# Patient Record
Sex: Male | Born: 1985 | Hispanic: Yes | Marital: Married | Smoking: Never smoker
Health system: Southern US, Community
[De-identification: ages and names within clinical notes are randomized; demographics above are authoritative.]

---

## 2021-02-01 ENCOUNTER — Emergency Department
Admission: EM | Admit: 2021-02-01 | Discharge: 2021-02-01 | Disposition: A | Discharge status: Home / Self Care | Payer: Self-pay | Attending: Emergency Medicine | Admitting: Emergency Medicine

## 2021-02-01 ENCOUNTER — Other Ambulatory Visit: Payer: Self-pay

## 2021-02-01 ENCOUNTER — Encounter: Payer: Self-pay | Admitting: Intensive Care

## 2021-02-01 DIAGNOSIS — G6289 Other specified polyneuropathies: Secondary | ICD-10-CM | POA: Insufficient documentation

## 2021-02-01 DIAGNOSIS — F1020 Alcohol dependence, uncomplicated: Secondary | ICD-10-CM | POA: Insufficient documentation

## 2021-02-01 DIAGNOSIS — D696 Thrombocytopenia, unspecified: Secondary | ICD-10-CM | POA: Insufficient documentation

## 2021-02-01 LAB — COMPREHENSIVE METABOLIC PANEL
ALT: 39 U/L (ref 0–44)
AST: 73 U/L — ABNORMAL HIGH (ref 15–41)
Albumin: 3.9 g/dL (ref 3.5–5.0)
Alkaline Phosphatase: 97 U/L (ref 38–126)
Anion gap: 11 (ref 5–15)
BUN: 6 mg/dL (ref 6–20)
CO2: 26 mmol/L (ref 22–32)
Calcium: 9.1 mg/dL (ref 8.9–10.3)
Chloride: 97 mmol/L — ABNORMAL LOW (ref 98–111)
Creatinine, Ser: 0.56 mg/dL — ABNORMAL LOW (ref 0.61–1.24)
GFR, Estimated: >60 mL/min (ref >60)
Glucose, Bld: 98 mg/dL (ref 70–99)
Potassium: 3.5 mmol/L (ref 3.5–5.1)
Sodium: 134 mmol/L — ABNORMAL LOW (ref 135–145)
Total Bilirubin: 3 mg/dL — ABNORMAL HIGH (ref 0.3–1.2)
Total Protein: 8.8 g/dL — ABNORMAL HIGH (ref 6.5–8.1)

## 2021-02-01 LAB — CBC
HCT: 43.4 % (ref 39.0–52.0)
Hemoglobin: 14.4 g/dL (ref 13.0–17.0)
MCH: 30.6 pg (ref 26.0–34.0)
MCHC: 33.2 g/dL (ref 30.0–36.0)
MCV: 92.3 fL (ref 80.0–100.0)
Platelets: 119 10*3/uL — ABNORMAL LOW (ref 150–400)
RBC: 4.7 MIL/uL (ref 4.22–5.81)
RDW: 14.3 % (ref 11.5–15.5)
WBC: 5.5 10*3/uL (ref 4.0–10.5)
nRBC: 0 % (ref 0.0–0.2)

## 2021-02-01 LAB — LIPASE, BLOOD: Lipase: 50 U/L (ref 11–51)

## 2021-02-01 NOTE — ED Provider Notes (Signed)
[facility] EMERGENCY DEPARTMENT Provider Note   CSN: [id number] Arrival date & time: 02/01/21  1410     History Chief Complaint  Patient presents with  . Leg Pain    Adam White is a 35 y.o. male presents to the emerge department for evaluation of numbness and tingling in both hands and feet x2 years.  Patient has not been treated or evaluated for this.  He states that his symptoms are worse after drinking.  He typically will drink 6-12 beers a day over the last 2 years.  He denies any weakness, trauma, edema.  No headaches nausea vomiting.  He denies any current abdominal pain but at times is drinking heavy amounts he will have some abdominal swelling and discomfort.  Patient denies any weakness in the upper or lower extremities.  He is currently not any pain.  HPI     History reviewed. No pertinent past medical history.  There are no problems to display for this patient.   History reviewed. No pertinent surgical history.     History reviewed. No pertinent family history.  Social History   Tobacco Use  . Smoking status: Never Smoker  . Smokeless tobacco: Never Used  Substance Use Topics  . Alcohol use: Yes    Alcohol/week: 12.0 standard drinks    Types: 12 Cans of beer per week  . Drug use: Never    Home Medications Prior to Admission medications   Not on File    Allergies    Patient has no known allergies.  Review of Systems   Review of Systems  Constitutional: Negative for chills and fever.  Respiratory: Negative for shortness of breath.   Cardiovascular: Negative for chest pain.  Gastrointestinal: Negative for nausea and vomiting.  Endocrine: Negative for polydipsia and polyuria.  Musculoskeletal: Negative for arthralgias.  Skin: Negative for color change, pallor, rash and wound.  Neurological: Positive for numbness. Negative for dizziness, weakness and headaches.    Physical Exam Updated Vital Signs BP (!)  139/91 (BP Location: Right Arm)   Pulse 87   Temp 98.4 F (36.9 C) (Oral)   Resp 16   Ht 5\' 6"  (1.676 m)   Wt 98.9 kg   SpO2 97%   BMI 35.19 kg/m   Physical Exam Constitutional:      Appearance: He is well-developed and well-nourished.  HENT:     Head: Normocephalic and atraumatic.  Eyes:     Conjunctiva/sclera: Conjunctivae normal.  Cardiovascular:     Rate and Rhythm: Normal rate.  Pulmonary:     Effort: Pulmonary effort is normal. No respiratory distress.  Abdominal:     Palpations: Abdomen is soft. There is no mass.     Tenderness: There is no abdominal tenderness. There is no guarding or rebound.  Musculoskeletal:        General: Normal range of motion.     Cervical back: Normal range of motion.     Comments: Patient with slight sensation loss in both feet, circumferential, up to the distal third of bilateral tib-fib regions.  There is no swelling edema or calf tenderness.  No warmth or redness.  He is able to ambulate with no antalgic gait and has no weakness on exam with ankle plantarflexion dorsiflexion, knee extension or flexion.  No clonus noted.  Skin:    General: Skin is warm.     Findings: No rash.  Neurological:     Mental Status: He is alert and oriented to person, place,  and time.  Psychiatric:        Mood and Affect: Mood and affect normal.        Behavior: Behavior normal.        Thought Content: Thought content normal.     ED Results / Procedures / Treatments   Labs (all labs ordered are listed, but only abnormal results are displayed) Labs Reviewed  CBC - Abnormal; Notable for the following components:      Result Value   Platelets 119 (*)    All other components within normal limits  COMPREHENSIVE METABOLIC PANEL - Abnormal; Notable for the following components:   Sodium 134 (*)    Chloride 97 (*)    Creatinine, Ser 0.56 (*)    Total Protein 8.8 (*)    AST 73 (*)    Total Bilirubin 3.0 (*)    All other components within normal limits   LIPASE, BLOOD    EKG None  Radiology No results found.  Procedures Procedures   Medications Ordered in ED Medications - No data to display  ED Course  I have reviewed the triage vital signs and the nursing notes.  Pertinent labs & imaging results that were available during my care of the patient were reviewed by me and considered in my medical decision making (see chart for details).    MDM Rules/Calculators/A&P                          35 year old male with history of heavy alcohol consumption.  Patient complains of numbness in his hands and feet consistent with likely peripheral neuropathy.  No weakness.  No neck or back pain.  Symptoms have been chronic.  Patient also complains of occasional abdominal pain.  Not currently having any abdominal pain.  Lab work consistent with heavy alcohol consumption with thrombocytopenia and slightly elevated liver enzymes.  Patient stable.  Encouraged him to establish care with primary care provider and we discussed alcohol cessation.  Patient understands signs symptoms to return to the ER for. Final Clinical Impression(s) / ED Diagnoses Final diagnoses:  Thrombocytopenia (HCC)  Other polyneuropathy  Alcoholism [facility])    Rx / DC Orders ED Discharge Orders    None       Ronnette Juniper 02/01/21 1837    Minna Antis, MD 02/01/21 2235

## 2021-02-01 NOTE — ED Notes (Addendum)
Patient reports pain to bilateral feet and legs x 2 years. Patient reports pain is a throbbing pain, and is constant, but worse with walking. Patient reports hx of heavy alcohol use, and related pancreatitis but reports recent decrease in consumption.

## 2021-02-01 NOTE — ED Triage Notes (Signed)
Patient c/o bilateral leg pain X2 months.

## 2021-02-01 NOTE — Discharge Instructions (Addendum)
Please establish care with a primary care provider.  Please reduce alcohol consumption.  Return to the ER for any worsening symptoms or urgent changes in your health.

## 2021-11-09 ENCOUNTER — Emergency Department: Payer: Medicaid Other

## 2021-11-09 ENCOUNTER — Inpatient Hospital Stay: Payer: Medicaid Other

## 2021-11-09 ENCOUNTER — Other Ambulatory Visit: Payer: Self-pay

## 2021-11-09 ENCOUNTER — Encounter: Payer: Self-pay | Admitting: Radiology

## 2021-11-09 ENCOUNTER — Inpatient Hospital Stay
Admission: EM | Admit: 2021-11-09 | Discharge: 2021-11-30 | DRG: 870 | Disposition: E | Payer: Medicaid Other | Attending: Internal Medicine | Admitting: Internal Medicine

## 2021-11-09 DIAGNOSIS — Z66 Do not resuscitate: Secondary | ICD-10-CM | POA: Diagnosis present

## 2021-11-09 DIAGNOSIS — J13 Pneumonia due to Streptococcus pneumoniae: Secondary | ICD-10-CM | POA: Diagnosis present

## 2021-11-09 DIAGNOSIS — E871 Hypo-osmolality and hyponatremia: Secondary | ICD-10-CM | POA: Diagnosis present

## 2021-11-09 DIAGNOSIS — I81 Portal vein thrombosis: Secondary | ICD-10-CM | POA: Diagnosis present

## 2021-11-09 DIAGNOSIS — Z452 Encounter for adjustment and management of vascular access device: Secondary | ICD-10-CM

## 2021-11-09 DIAGNOSIS — E8729 Other acidosis: Secondary | ICD-10-CM | POA: Diagnosis present

## 2021-11-09 DIAGNOSIS — G928 Other toxic encephalopathy: Secondary | ICD-10-CM | POA: Diagnosis present

## 2021-11-09 DIAGNOSIS — K7040 Alcoholic hepatic failure without coma: Secondary | ICD-10-CM | POA: Diagnosis present

## 2021-11-09 DIAGNOSIS — D696 Thrombocytopenia, unspecified: Secondary | ICD-10-CM | POA: Diagnosis present

## 2021-11-09 DIAGNOSIS — A403 Sepsis due to Streptococcus pneumoniae: Principal | ICD-10-CM | POA: Diagnosis present

## 2021-11-09 DIAGNOSIS — J9602 Acute respiratory failure with hypercapnia: Secondary | ICD-10-CM | POA: Diagnosis present

## 2021-11-09 DIAGNOSIS — F1010 Alcohol abuse, uncomplicated: Secondary | ICD-10-CM | POA: Diagnosis present

## 2021-11-09 DIAGNOSIS — K7031 Alcoholic cirrhosis of liver with ascites: Secondary | ICD-10-CM | POA: Diagnosis present

## 2021-11-09 DIAGNOSIS — Z20822 Contact with and (suspected) exposure to covid-19: Secondary | ICD-10-CM | POA: Diagnosis present

## 2021-11-09 DIAGNOSIS — R000 Tachycardia, unspecified: Secondary | ICD-10-CM

## 2021-11-09 DIAGNOSIS — N179 Acute kidney failure, unspecified: Secondary | ICD-10-CM | POA: Diagnosis not present

## 2021-11-09 DIAGNOSIS — D649 Anemia, unspecified: Secondary | ICD-10-CM | POA: Diagnosis present

## 2021-11-09 DIAGNOSIS — J189 Pneumonia, unspecified organism: Secondary | ICD-10-CM | POA: Diagnosis present

## 2021-11-09 DIAGNOSIS — E876 Hypokalemia: Secondary | ICD-10-CM | POA: Diagnosis present

## 2021-11-09 DIAGNOSIS — E872 Acidosis, unspecified: Secondary | ICD-10-CM | POA: Diagnosis present

## 2021-11-09 DIAGNOSIS — A419 Sepsis, unspecified organism: Secondary | ICD-10-CM

## 2021-11-09 DIAGNOSIS — J9601 Acute respiratory failure with hypoxia: Secondary | ICD-10-CM | POA: Diagnosis present

## 2021-11-09 DIAGNOSIS — K625 Hemorrhage of anus and rectum: Secondary | ICD-10-CM | POA: Diagnosis not present

## 2021-11-09 DIAGNOSIS — E669 Obesity, unspecified: Secondary | ICD-10-CM | POA: Diagnosis present

## 2021-11-09 DIAGNOSIS — R040 Epistaxis: Secondary | ICD-10-CM | POA: Diagnosis not present

## 2021-11-09 DIAGNOSIS — R6521 Severe sepsis with septic shock: Secondary | ICD-10-CM | POA: Diagnosis present

## 2021-11-09 DIAGNOSIS — R7989 Other specified abnormal findings of blood chemistry: Secondary | ICD-10-CM

## 2021-11-09 DIAGNOSIS — Z6838 Body mass index (BMI) 38.0-38.9, adult: Secondary | ICD-10-CM | POA: Diagnosis not present

## 2021-11-09 LAB — PHOSPHORUS: Phosphorus: 1.2 mg/dL — ABNORMAL LOW (ref 2.5–4.6)

## 2021-11-09 LAB — BLOOD GAS, ARTERIAL
Acid-base deficit: 6.1 mmol/L — ABNORMAL HIGH (ref 0.0–2.0)
Acid-base deficit: 8.3 mmol/L — ABNORMAL HIGH (ref 0.0–2.0)
Acid-base deficit: 7.2 mmol/L — ABNORMAL HIGH (ref 0.0–2.0)
Bicarbonate: 22.9 mmol/L (ref 20.0–28.0)
Bicarbonate: 22.8 mmol/L (ref 20.0–28.0)
Bicarbonate: 21.1 mmol/L (ref 20.0–28.0)
FIO2: 1
FIO2: 1
FIO2: 1
MECHVT: 480 mL
MECHVT: 360 mL
MECHVT: 400 mL
Mechanical Rate: 30
O2 Saturation: 88.1 %
O2 Saturation: 95.1 %
O2 Saturation: 97.4 %
PEEP: 12 cmH2O
PEEP: 16 cmH2O
PEEP: 10 cmH2O
Patient temperature: 37
Patient temperature: 37
Patient temperature: 37
RATE: 16 resp/min
RATE: 25 resp/min
RATE: 30 resp/min
pCO2 arterial: 60 mmHg — ABNORMAL HIGH (ref 32.0–48.0)
pCO2 arterial: 75 mmHg (ref 32.0–48.0)
pCO2 arterial: 54 mmHg — ABNORMAL HIGH (ref 32.0–48.0)
pH, Arterial: 7.19 — CL (ref 7.350–7.450)
pH, Arterial: 7.09 — CL (ref 7.350–7.450)
pH, Arterial: 7.2 — ABNORMAL LOW (ref 7.350–7.450)
pO2, Arterial: 68 mmHg — ABNORMAL LOW (ref 83.0–108.0)
pO2, Arterial: 102 mmHg (ref 83.0–108.0)
pO2, Arterial: 115 mmHg — ABNORMAL HIGH (ref 83.0–108.0)

## 2021-11-09 LAB — CBC WITH DIFFERENTIAL/PLATELET
Abs Immature Granulocytes: 0.1 10*3/uL — ABNORMAL HIGH (ref 0.00–0.07)
Basophils Absolute: 0.1 10*3/uL (ref 0.0–0.1)
Basophils Relative: 2 %
Eosinophils Absolute: 0 10*3/uL (ref 0.0–0.5)
Eosinophils Relative: 0 %
HCT: 38.3 % — ABNORMAL LOW (ref 39.0–52.0)
Hemoglobin: 12.9 g/dL — ABNORMAL LOW (ref 13.0–17.0)
Immature Granulocytes: 2 %
Lymphocytes Relative: 14 %
Lymphs Abs: 0.7 10*3/uL (ref 0.7–4.0)
MCH: 28.9 pg (ref 26.0–34.0)
MCHC: 33.7 g/dL (ref 30.0–36.0)
MCV: 85.7 fL (ref 80.0–100.0)
Monocytes Absolute: 0.2 10*3/uL (ref 0.1–1.0)
Monocytes Relative: 4 %
Neutro Abs: 4.1 10*3/uL (ref 1.7–7.7)
Neutrophils Relative %: 78 %
Platelets: 96 10*3/uL — ABNORMAL LOW (ref 150–400)
RBC: 4.47 MIL/uL (ref 4.22–5.81)
RDW: 15.9 % — ABNORMAL HIGH (ref 11.5–15.5)
WBC: 5.2 10*3/uL (ref 4.0–10.5)
nRBC: 0.6 % — ABNORMAL HIGH (ref 0.0–0.2)

## 2021-11-09 LAB — LIPASE, BLOOD: Lipase: 30 U/L (ref 11–51)

## 2021-11-09 LAB — GLUCOSE, CAPILLARY: Glucose-Capillary: 130 mg/dL — ABNORMAL HIGH (ref 70–99)

## 2021-11-09 LAB — URINALYSIS, COMPLETE (UACMP) WITH MICROSCOPIC
Bacteria, UA: NONE SEEN
Bilirubin Urine: NEGATIVE
Glucose, UA: NEGATIVE mg/dL
Ketones, ur: NEGATIVE mg/dL
Leukocytes,Ua: NEGATIVE
Nitrite: NEGATIVE
Protein, ur: 30 mg/dL — AB
Specific Gravity, Urine: 1.034 — ABNORMAL HIGH (ref 1.005–1.030)
Squamous Epithelial / HPF: NONE SEEN (ref 0–5)
WBC, UA: NONE SEEN WBC/hpf (ref 0–5)
pH: 5 (ref 5.0–8.0)

## 2021-11-09 LAB — RESP PANEL BY RT-PCR (FLU A&B, COVID) ARPGX2
Influenza A by PCR: NEGATIVE
Influenza B by PCR: NEGATIVE
SARS Coronavirus 2 by RT PCR: NEGATIVE

## 2021-11-09 LAB — LACTIC ACID, PLASMA
Lactic Acid, Venous: 2.7 mmol/L (ref 0.5–1.9)
Lactic Acid, Venous: 1.7 mmol/L (ref 0.5–1.9)

## 2021-11-09 LAB — SODIUM, URINE, RANDOM: Sodium, Ur: <10 mmol/L

## 2021-11-09 LAB — BASIC METABOLIC PANEL
Anion gap: 8 (ref 5–15)
BUN: 38 mg/dL — ABNORMAL HIGH (ref 6–20)
CO2: 19 mmol/L — ABNORMAL LOW (ref 22–32)
Calcium: 6.8 mg/dL — ABNORMAL LOW (ref 8.9–10.3)
Chloride: 91 mmol/L — ABNORMAL LOW (ref 98–111)
Creatinine, Ser: 0.95 mg/dL (ref 0.61–1.24)
GFR, Estimated: >60 mL/min (ref >60)
Glucose, Bld: 107 mg/dL — ABNORMAL HIGH (ref 70–99)
Potassium: 2.6 mmol/L — CL (ref 3.5–5.1)
Sodium: 118 mmol/L — CL (ref 135–145)

## 2021-11-09 LAB — MRSA NEXT GEN BY PCR, NASAL: MRSA by PCR Next Gen: NOT DETECTED

## 2021-11-09 LAB — TROPONIN I (HIGH SENSITIVITY)
Troponin I (High Sensitivity): 10 ng/L (ref <18)
Troponin I (High Sensitivity): 10 ng/L (ref <18)

## 2021-11-09 LAB — MAGNESIUM: Magnesium: 1.7 mg/dL (ref 1.7–2.4)

## 2021-11-09 LAB — COMPREHENSIVE METABOLIC PANEL
ALT: 25 U/L (ref 0–44)
AST: 58 U/L — ABNORMAL HIGH (ref 15–41)
Albumin: 3 g/dL — ABNORMAL LOW (ref 3.5–5.0)
Alkaline Phosphatase: 22 U/L — ABNORMAL LOW (ref 38–126)
Anion gap: 9 (ref 5–15)
BUN: 41 mg/dL — ABNORMAL HIGH (ref 6–20)
CO2: 21 mmol/L — ABNORMAL LOW (ref 22–32)
Calcium: 7.8 mg/dL — ABNORMAL LOW (ref 8.9–10.3)
Chloride: 87 mmol/L — ABNORMAL LOW (ref 98–111)
Creatinine, Ser: 1.02 mg/dL (ref 0.61–1.24)
GFR, Estimated: >60 mL/min (ref >60)
Glucose, Bld: 144 mg/dL — ABNORMAL HIGH (ref 70–99)
Potassium: 2.8 mmol/L — ABNORMAL LOW (ref 3.5–5.1)
Sodium: 117 mmol/L — CL (ref 135–145)
Total Bilirubin: 3.8 mg/dL — ABNORMAL HIGH (ref 0.3–1.2)
Total Protein: 8.7 g/dL — ABNORMAL HIGH (ref 6.5–8.1)

## 2021-11-09 LAB — PROCALCITONIN: Procalcitonin: 3.83 ng/mL

## 2021-11-09 LAB — PROTIME-INR
INR: 1.4 — ABNORMAL HIGH (ref 0.8–1.2)
Prothrombin Time: 17.3 seconds — ABNORMAL HIGH (ref 11.4–15.2)

## 2021-11-09 LAB — STREP PNEUMONIAE URINARY ANTIGEN: Strep Pneumo Urinary Antigen: POSITIVE — AB

## 2021-11-09 LAB — APTT: aPTT: 39 seconds — ABNORMAL HIGH (ref 24–36)

## 2021-11-09 LAB — OSMOLALITY, URINE: Osmolality, Ur: 454 mOsm/kg (ref 300–900)

## 2021-11-09 LAB — TSH: TSH: 0.573 u[IU]/mL (ref 0.350–4.500)

## 2021-11-09 LAB — SODIUM
Sodium: 121 mmol/L — ABNORMAL LOW (ref 135–145)
Sodium: 123 mmol/L — ABNORMAL LOW (ref 135–145)

## 2021-11-09 MED ORDER — ORAL CARE MOUTH RINSE
15.0000 mL | OROMUCOSAL | Status: DC
  Administered 2021-11-09 – 2021-11-14 (×44): 15 mL via OROMUCOSAL

## 2021-11-09 MED ORDER — SODIUM CHLORIDE 0.9 % IV BOLUS
2000.0000 mL | Freq: Once | INTRAVENOUS | Status: AC
  Administered 2021-11-09: 2000 mL via INTRAVENOUS

## 2021-11-09 MED ORDER — CHLORHEXIDINE GLUCONATE 0.12% ORAL RINSE (MEDLINE KIT)
15.0000 mL | Freq: Two times a day (BID) | OROMUCOSAL | Status: DC
  Administered 2021-11-09 – 2021-11-13 (×9): 15 mL via OROMUCOSAL

## 2021-11-09 MED ORDER — MAGNESIUM SULFATE 2 GM/50ML IV SOLN
2.0000 g | Freq: Once | INTRAVENOUS | Status: AC
  Administered 2021-11-09: 2 g via INTRAVENOUS
  Filled 2021-11-09: qty 50

## 2021-11-09 MED ORDER — FENTANYL BOLUS VIA INFUSION
100.0000 ug | Freq: Once | INTRAVENOUS | Status: AC
  Administered 2021-11-09: 100 ug via INTRAVENOUS
  Filled 2021-11-09: qty 100

## 2021-11-09 MED ORDER — ROCURONIUM BROMIDE 50 MG/5ML IV SOLN
INTRAVENOUS | Status: AC | PRN
  Administered 2021-11-09: 130 mg via INTRAVENOUS

## 2021-11-09 MED ORDER — LORAZEPAM 2 MG/ML IJ SOLN: 1.0000 mg | Freq: Once | INTRAMUSCULAR | Status: AC

## 2021-11-09 MED ORDER — PANTOPRAZOLE SODIUM 40 MG IV SOLR
40.0000 mg | Freq: Every day | INTRAVENOUS | Status: DC
  Administered 2021-11-09 – 2021-11-11 (×3): 40 mg via INTRAVENOUS
  Filled 2021-11-09 (×3): qty 40

## 2021-11-09 MED ORDER — POTASSIUM CHLORIDE 10 MEQ/100ML IV SOLN
10.0000 meq | INTRAVENOUS | Status: AC
  Administered 2021-11-09 (×6): 10 meq via INTRAVENOUS
  Filled 2021-11-09 (×6): qty 100

## 2021-11-09 MED ORDER — LACTATED RINGERS IV SOLN: INTRAVENOUS | Status: DC

## 2021-11-09 MED ORDER — SODIUM CHLORIDE 0.9 % IV SOLN: 500.0000 mg | INTRAVENOUS | Status: DC

## 2021-11-09 MED ORDER — POLYETHYLENE GLYCOL 3350 17 G PO PACK: 17.0000 g | PACK | Freq: Every day | ORAL | Status: DC

## 2021-11-09 MED ORDER — VANCOMYCIN HCL IN DEXTROSE 1-5 GM/200ML-% IV SOLN
1000.0000 mg | Freq: Once | INTRAVENOUS | Status: AC
  Administered 2021-11-09: 1000 mg via INTRAVENOUS

## 2021-11-09 MED ORDER — VANCOMYCIN HCL IN DEXTROSE 1-5 GM/200ML-% IV SOLN
1000.0000 mg | Freq: Two times a day (BID) | INTRAVENOUS | Status: DC
  Administered 2021-11-10: 1000 mg via INTRAVENOUS
  Filled 2021-11-09 (×3): qty 200

## 2021-11-09 MED ORDER — PIPERACILLIN-TAZOBACTAM 3.375 G IVPB
3.3750 g | Freq: Three times a day (TID) | INTRAVENOUS | Status: DC
Start: 2021-11-09 — End: 2021-11-10
  Administered 2021-11-09 – 2021-11-10 (×2): 3.375 g via INTRAVENOUS
  Filled 2021-11-09 (×2): qty 50

## 2021-11-09 MED ORDER — VECURONIUM BROMIDE 10 MG IV SOLR: 10.0000 mg | INTRAVENOUS | Status: DC | PRN

## 2021-11-09 MED ORDER — FENTANYL BOLUS VIA INFUSION
50.0000 ug | INTRAVENOUS | Status: DC | PRN
  Administered 2021-11-09: 50 ug via INTRAVENOUS
  Filled 2021-11-09: qty 100

## 2021-11-09 MED ORDER — PROPOFOL 1000 MG/100ML IV EMUL
5.0000 ug/kg/min | INTRAVENOUS | Status: DC
  Administered 2021-11-09: 50 ug/kg/min via INTRAVENOUS
  Administered 2021-11-09: 70 ug/kg/min via INTRAVENOUS
  Administered 2021-11-09: 5 ug/kg/min via INTRAVENOUS
  Administered 2021-11-10 (×2): 70 ug/kg/min via INTRAVENOUS
  Administered 2021-11-10 (×2): 50 ug/kg/min via INTRAVENOUS
  Administered 2021-11-10 (×2): 70 ug/kg/min via INTRAVENOUS
  Administered 2021-11-10 – 2021-11-11 (×3): 50 ug/kg/min via INTRAVENOUS
  Administered 2021-11-11 (×2): 70 ug/kg/min via INTRAVENOUS
  Administered 2021-11-11: 80 ug/kg/min via INTRAVENOUS
  Administered 2021-11-11: 55 ug/kg/min via INTRAVENOUS
  Administered 2021-11-11: 50 ug/kg/min via INTRAVENOUS
  Administered 2021-11-11: 80 ug/kg/min via INTRAVENOUS
  Administered 2021-11-11: 70 ug/kg/min via INTRAVENOUS
  Administered 2021-11-11 (×2): 50 ug/kg/min via INTRAVENOUS
  Administered 2021-11-12 – 2021-11-13 (×12): 80 ug/kg/min via INTRAVENOUS
  Administered 2021-11-13: 70 ug/kg/min via INTRAVENOUS
  Administered 2021-11-13 (×4): 80 ug/kg/min via INTRAVENOUS
  Filled 2021-11-09 (×38): qty 100

## 2021-11-09 MED ORDER — LORAZEPAM 2 MG/ML IJ SOLN
INTRAMUSCULAR | Status: AC
  Administered 2021-11-09: 1 mg via INTRAVENOUS
  Filled 2021-11-09: qty 1

## 2021-11-09 MED ORDER — POLYETHYLENE GLYCOL 3350 17 G PO PACK
17.0000 g | PACK | Freq: Every day | ORAL | Status: DC
  Administered 2021-11-10 – 2021-11-13 (×4): 17 g
  Filled 2021-11-09 (×4): qty 1

## 2021-11-09 MED ORDER — IOHEXOL 350 MG/ML SOLN
75.0000 mL | Freq: Once | INTRAVENOUS | Status: AC | PRN
  Administered 2021-11-09: 75 mL via INTRAVENOUS

## 2021-11-09 MED ORDER — DOCUSATE SODIUM 100 MG PO CAPS: 100.0000 mg | ORAL_CAPSULE | Freq: Two times a day (BID) | ORAL | Status: DC | PRN

## 2021-11-09 MED ORDER — KETAMINE HCL 50 MG/5ML IJ SOSY
PREFILLED_SYRINGE | INTRAMUSCULAR | Status: AC
  Filled 2021-11-09: qty 5

## 2021-11-09 MED ORDER — SODIUM CHLORIDE 0.9 % IV BOLUS: 2000.0000 mL | Freq: Once | INTRAVENOUS | Status: DC

## 2021-11-09 MED ORDER — FENTANYL CITRATE PF 50 MCG/ML IJ SOSY
50.0000 ug | PREFILLED_SYRINGE | INTRAMUSCULAR | Status: DC | PRN
  Administered 2021-11-09: 100 ug via INTRAVENOUS

## 2021-11-09 MED ORDER — MIDAZOLAM HCL 5 MG/5ML IJ SOLN
INTRAMUSCULAR | Status: AC | PRN
  Administered 2021-11-09 (×2): 2 mg via INTRAVENOUS

## 2021-11-09 MED ORDER — IOHEXOL 300 MG/ML  SOLN: 100.0000 mL | Freq: Once | INTRAMUSCULAR | Status: DC | PRN

## 2021-11-09 MED ORDER — SODIUM CHLORIDE 0.9 % IV SOLN: 2.0000 g | INTRAVENOUS | Status: DC

## 2021-11-09 MED ORDER — FENTANYL 2500MCG IN NS 250ML (10MCG/ML) PREMIX INFUSION
0.0000 ug/h | INTRAVENOUS | Status: DC
  Administered 2021-11-09: 25 ug/h via INTRAVENOUS

## 2021-11-09 MED ORDER — SODIUM CHLORIDE 0.9 % IV SOLN: INTRAVENOUS | Status: DC

## 2021-11-09 MED ORDER — FENTANYL 2500MCG IN NS 250ML (10MCG/ML) PREMIX INFUSION
INTRAVENOUS | Status: AC
  Filled 2021-11-09: qty 250

## 2021-11-09 MED ORDER — SODIUM CHLORIDE 0.9 % IV BOLUS (SEPSIS): 1000.0000 mL | Freq: Once | INTRAVENOUS | Status: DC

## 2021-11-09 MED ORDER — DOCUSATE SODIUM 50 MG/5ML PO LIQD: 100.0000 mg | Freq: Two times a day (BID) | ORAL | Status: DC

## 2021-11-09 MED ORDER — FENTANYL CITRATE PF 50 MCG/ML IJ SOSY
50.0000 ug | PREFILLED_SYRINGE | INTRAMUSCULAR | Status: DC | PRN
  Administered 2021-11-10 – 2021-11-11 (×2): 50 ug via INTRAVENOUS
  Filled 2021-11-09 (×2): qty 1

## 2021-11-09 MED ORDER — ENOXAPARIN SODIUM 60 MG/0.6ML IJ SOSY
0.5000 mg/kg | PREFILLED_SYRINGE | INTRAMUSCULAR | Status: DC
  Filled 2021-11-09: qty 0.5

## 2021-11-09 MED ORDER — FENTANYL CITRATE PF 50 MCG/ML IJ SOSY
PREFILLED_SYRINGE | INTRAMUSCULAR | Status: AC
  Filled 2021-11-09: qty 2

## 2021-11-09 MED ORDER — MIDAZOLAM HCL 2 MG/2ML IJ SOLN
INTRAMUSCULAR | Status: AC
  Filled 2021-11-09: qty 2

## 2021-11-09 MED ORDER — POTASSIUM PHOSPHATES 15 MMOLE/5ML IV SOLN
30.0000 mmol | Freq: Once | INTRAVENOUS | Status: AC
  Administered 2021-11-09: 30 mmol via INTRAVENOUS
  Filled 2021-11-09: qty 10

## 2021-11-09 MED ORDER — NOREPINEPHRINE 4 MG/250ML-% IV SOLN
2.0000 ug/min | INTRAVENOUS | Status: DC
  Filled 2021-11-09: qty 250

## 2021-11-09 MED ORDER — POLYETHYLENE GLYCOL 3350 17 G PO PACK: 17.0000 g | PACK | Freq: Every day | ORAL | Status: DC | PRN

## 2021-11-09 MED ORDER — VANCOMYCIN HCL IN DEXTROSE 1-5 GM/200ML-% IV SOLN
1000.0000 mg | Freq: Once | INTRAVENOUS | Status: AC
  Administered 2021-11-09: 1000 mg via INTRAVENOUS
  Filled 2021-11-09: qty 200

## 2021-11-09 MED ORDER — MORPHINE SULFATE (PF) 2 MG/ML IV SOLN: 2.0000 mg | Freq: Once | INTRAVENOUS | Status: AC

## 2021-11-09 MED ORDER — MIDAZOLAM HCL 2 MG/2ML IJ SOLN
2.0000 mg | INTRAMUSCULAR | Status: DC | PRN
  Administered 2021-11-12: 19:00:00 2 mg via INTRAVENOUS
  Filled 2021-11-09 (×2): qty 2

## 2021-11-09 MED ORDER — CHLORHEXIDINE GLUCONATE CLOTH 2 % EX PADS
6.0000 | MEDICATED_PAD | Freq: Every day | CUTANEOUS | Status: DC
  Administered 2021-11-09 – 2021-11-13 (×4): 6 via TOPICAL

## 2021-11-09 MED ORDER — FENTANYL CITRATE PF 50 MCG/ML IJ SOSY: 50.0000 ug | PREFILLED_SYRINGE | Freq: Once | INTRAMUSCULAR | Status: DC

## 2021-11-09 MED ORDER — FENTANYL 2500MCG IN NS 250ML (10MCG/ML) PREMIX INFUSION
50.0000 ug/h | INTRAVENOUS | Status: DC
Start: 2021-11-09 — End: 2021-11-11
  Administered 2021-11-09 – 2021-11-11 (×4): 200 ug/h via INTRAVENOUS
  Filled 2021-11-09 (×4): qty 250

## 2021-11-09 MED ORDER — SODIUM CHLORIDE 0.9 % IV SOLN
2.0000 g | Freq: Once | INTRAVENOUS | Status: AC
  Administered 2021-11-09: 2 g via INTRAVENOUS
  Filled 2021-11-09: qty 20

## 2021-11-09 MED ORDER — SODIUM CHLORIDE 0.9 % IV SOLN: 250.0000 mL | INTRAVENOUS | Status: DC

## 2021-11-09 MED ORDER — MORPHINE SULFATE (PF) 2 MG/ML IV SOLN
INTRAVENOUS | Status: AC
  Administered 2021-11-09: 2 mg via INTRAVENOUS
  Filled 2021-11-09: qty 1

## 2021-11-09 MED ORDER — NOREPINEPHRINE 4 MG/250ML-% IV SOLN
INTRAVENOUS | Status: AC
  Administered 2021-11-09: 2 ug/min via INTRAVENOUS
  Filled 2021-11-09: qty 250

## 2021-11-09 MED ORDER — ROCURONIUM BROMIDE 10 MG/ML (PF) SYRINGE
PREFILLED_SYRINGE | INTRAVENOUS | Status: AC
  Filled 2021-11-09: qty 10

## 2021-11-09 MED ORDER — MIDAZOLAM HCL 2 MG/2ML IJ SOLN
2.0000 mg | INTRAMUSCULAR | Status: DC | PRN
  Administered 2021-11-11 – 2021-11-12 (×2): 2 mg via INTRAVENOUS
  Filled 2021-11-09: qty 2

## 2021-11-09 MED ORDER — PIPERACILLIN-TAZOBACTAM 3.375 G IVPB 30 MIN: 3.3750 g | Freq: Once | INTRAVENOUS | Status: DC

## 2021-11-09 MED ORDER — ACETAMINOPHEN 500 MG PO TABS
1000.0000 mg | ORAL_TABLET | Freq: Once | ORAL | Status: AC
  Administered 2021-11-09: 1000 mg via ORAL
  Filled 2021-11-09: qty 2

## 2021-11-09 MED ORDER — KETAMINE HCL 10 MG/ML IJ SOLN
INTRAMUSCULAR | Status: AC | PRN
  Administered 2021-11-09 (×5): 10 mg via INTRAVENOUS
  Administered 2021-11-09: 20 mg via INTRAVENOUS
  Administered 2021-11-09 (×2): 10 mg via INTRAVENOUS

## 2021-11-09 MED ORDER — SODIUM CHLORIDE 0.9 % IV SOLN
500.0000 mg | Freq: Once | INTRAVENOUS | Status: AC
  Administered 2021-11-09: 500 mg via INTRAVENOUS
  Filled 2021-11-09: qty 5

## 2021-11-09 MED ORDER — DOCUSATE SODIUM 50 MG/5ML PO LIQD
100.0000 mg | Freq: Two times a day (BID) | ORAL | Status: DC
  Administered 2021-11-10 – 2021-11-13 (×6): 100 mg
  Filled 2021-11-09 (×6): qty 10

## 2021-11-09 MED ORDER — KETAMINE HCL 50 MG/5ML IJ SOSY: 1.0000 mg/kg | PREFILLED_SYRINGE | Freq: Once | INTRAMUSCULAR | Status: DC

## 2021-11-09 NOTE — Progress Notes (Signed)
eLINK following for sepsis protocol. ?

## 2021-11-09 NOTE — Procedures (Signed)
Bronchoscopy Procedure Note  Adam White  [id number]  1986/03/22  Date:11/06/2021  Time:6:50 PM   Provider Performing:Johnanthony Wilden Sharene Butters   Procedure(s):  Flexible bronchoscopy with bronchial alveolar lavage [id number])  Indication(s) Abnormal CT chest  Consent Risks of the procedure as well as the alternatives and risks of each were explained to the patient and/or caregiver.  Consent for the procedure was obtained and is signed in the bedside chart  Anesthesia Propofol   Time Out Verified patient identification, verified procedure, site/side was marked, verified correct patient position, special equipment/implants available, medications/allergies/relevant history reviewed, required imaging and test results available.   Sterile Technique Usual hand hygiene, masks, gowns, and gloves were used   Procedure Description Bronchoscope advanced through endotracheal tube and into airway.  Airways were examined down to subsegmental level with findings noted below.   Following diagnostic evaluation, BAL(s) performed in superior segment of lingula with normal saline and return of 20 cc cloudy, red-tinged fluid  Findings: Airway was grossly bronchitic. No endobronchial obstruction or extrinsic compression noted. No significant mucoid secretions. Plaquelike lesion in the medial aspect of the bronchial wall just distal to the secondary carina on the left; not sampled but could be amenable for brush/forceps biopsy once patient is further stabilized.   Complications/Tolerance None; patient tolerated the procedure well. Chest X-ray is not needed post procedure.   EBL Minimal   Specimen(s) BAL sent for bacterial, fungal, AFB cultures, cytology  Bennie Pierini, MD 11/05/2021 6:55 PM

## 2021-11-09 NOTE — ED Notes (Signed)
MD Malinda in triage room with patient. Patient taken off oxygen via face mask at 6L, and placed on non-rebreather.

## 2021-11-09 NOTE — Consult Note (Signed)
PHARMACY CONSULT NOTE  Pharmacy Consult for Electrolyte Monitoring and Replacement   Recent Labs: Potassium (mmol/L)  Date Value  11/25/21 2.6 (LL)   Calcium (mg/dL)  Date Value  01/75/1025 6.8 (L)   Albumin (g/dL)  Date Value  85/27/7824 3.0 (L)   Sodium (mmol/L)  Date Value  11-25-2021 118 (LL)    Assessment: Patient is a 35 y/o M with history of alcohol use disorder who presented to the ED 12/11 with pleuritic chest pain. CTA interpretation with bilateral pulmonary masses and associated left effusion concerning for malignancy with infectious process or granulomatous disease felt to be less likely. Appears patient was intubated in the ED. Pharmacy consulted to assist with electrolyte monitoring and replacement as indicated.  Goal of Therapy:  Electrolytes within normal limits  Plan:  --Na 118, serum osmolality not available. Suspect hypotonic hypovolemic hyponatremia +/- SIADH (paraneoplastic syndrome). Patient receiving aggressive fluid resuscitation for sepsis --K 2.6, IV Kcl 10 mEq x 6 doses --Check Mg, Phos --Follow-up electrolytes tonight at 2200 and again tomorrow AM  Tressie Ellis 11/25/21 3:29 PM

## 2021-11-09 NOTE — ED Notes (Signed)
Pt taken for CT 

## 2021-11-09 NOTE — ED Triage Notes (Signed)
Patient c/o fever, chest pain, coughing and SOB.

## 2021-11-09 NOTE — Progress Notes (Signed)
CODE SEPSIS - PHARMACY COMMUNICATION  **Broad Spectrum Antibiotics should be administered within 1 hour of Sepsis diagnosis**  Time Code Sepsis Called/Page Received: 1209  Antibiotics Ordered: ceftriaxone/azithromycin/vancomycin  Time of 1st antibiotic administration: 1154   Pricilla Riffle, PharmD, BCPS Clinical Pharmacist 11/07/2021 12:13 PM

## 2021-11-09 NOTE — H&P (Addendum)
NAME:  Adam White, MRN:  [id number], DOB:  [birth year], LOS: 0 ADMISSION DATE:  11/12/2021, CONSULTATION DATE: 11/23/2021 REFERRING MD: Dorothea Glassman, MD, CHIEF COMPLAINT: Shortness of breath  HPI  35 y.o with significant PMH of EtOH abuse and  thrombocytopenia who presented to the ED with chief complaints of progressive shortness of breath.  Patient is currently on high flow with increased work of breathing unable to provide reliable history, history mostly obtained from patient's wife who is currently at the bedside.  Per patient's wife, patient's symptoms started last Thursday with c/o of fevers and chills, chest pain/discomfort, productive cough of brown sputum and poor p.o. intake. Patient developed worsening symptoms today prompting ED visit.  ED Course: On arrival to the ED, he was febrile (!) 102.8 F (39.3 C) with blood pressure 126/76 mm Hg and pulse rate (!) 140 beats/min, respirations of (!) 31 beats per minute with sats of 88 % on NRB.  Patient was noted with increased work of breathing on oxygen via facemask at 6 L, and was placed on nonrebreather.  Patient was subsequently placed on BiPAP for respiratory distress however he was unable to tolerate despite down titrating pressures and IV Ativan.  Patient was placed back on HFNC on 15 L/min / 60% with good tolerance.   Pertinent Labs in Red/Diagnostics Findings: Na+/ K+: 117/2.8 Glucose: 144 BUN/Cr.: 41/1.02 Calcium: 7.8 AST/ALT:58/25   WBC/ TMAX: 5.2/ 102.8 Hgb/Hct: 12.9/38.3 Plts: 96 PCT: 3.83 Lactic acid: 2.7 COVID PCR: Negative   Troponin: 10 BNP: Pending Venous Blood Gas result:  pO2 44.0; pCO2 29; pH 7.48;  HCO3 21.6, %O2 Sat 83.5. EKG read interpreted by me shows sinus tachycardia rate of 141 severe right axis deviation no acute ST-T wave changes are seen.  Given finding as above, code sepsis initiated and patient started on broad-spectrum antibiotics with ceftriaxone, vancomycin and azithromycin. Due  to progressive worsening of hypoxia and need for bronchoscopy patient was intubated in the ED.  PCCM consulted  Past Medical History  EtOH abuse Thrombocytopenia  Significant Hospital Events   12/11: Admitted to ICU with acute hypoxic respiratory failure  Consults:  PCCM  Procedures:  12/11: Intubation  Significant Diagnostic Tests:  12/11: Chest Xray>Rounded density is noted in right lower lobe concerning for mass or nodule. Nodular opacities are seen in the left midlung. Some degree of left pleural effusion is noted as well. CT scan of the chest is recommended for further evaluation. 12/11: CTA chest>Bilateral pulmonary pulmonary masses and confluent nodularity. Dense near complete consolidation of the LEFT lower lobe with associated LEFT effusion. Findings are concerning for pulmonary malignancy. Consider LYMPHOMA. Granulomatous disease or infectious process is much less favored. 12/11: CTA abdomen and pelvis>  Micro Data:  12/11: SARS-CoV-2 PCR> negative 12/11: Influenza PCR> negative 12/11: Blood culture x2> 12/11: Urine Culture> 12/11: MRSA PCR>>  12/11: Strep pneumo urinary antigen> 12/11: Legionella urinary antigen> 12/11: Mycoplasma pneumonia>  Antimicrobials:  Vancomycin 12/11> Azithromycin 12/11> Ceftriaxone 12/11>  OBJECTIVE  Blood pressure (!) 103/48, pulse (!) 127, temperature (!) 102.8 F (39.3 C), temperature source Axillary, resp. rate (!) 41, height 5\' 6"  (1.676 m), weight 98.9 kg, SpO2 94 %.    FiO2 (%):  [60 %] 60 %   Intake/Output Summary (Last 24 hours) at 11/29/2021 1527 Last data filed at 11/03/2021 1349 Gross per 24 hour  Intake 350 ml  Output --  Net 350 ml   Filed Weights   11/29/2021 1107  Weight: 98.9 kg   Physical Examination  GENERAL: 35 year-old critically ill patient lying in the bed intubated and sedated EYES: Pupils equal, round, reactive to light and accommodation. No scleral icterus. Extraocular muscles intact.  HEENT: Head  atraumatic, normocephalic. Oropharynx and nasopharynx clear.  NECK:  Supple, no jugular venous distention. No thyroid enlargement, no tenderness.  LUNGS: Normal breath sounds bilaterally, no wheezing, rales,rhonchi or crepitation. No use of accessory muscles of respiration.  CARDIOVASCULAR: S1, S2 normal. No murmurs, rubs, or gallops.  ABDOMEN: Abdomen distended.  Bowel sounds present. No organomegaly or mass.  EXTREMITIES: No pedal edema, cyanosis, or clubbing.  NEUROLOGIC: Cranial nerves II through XII are intact.  Muscle strength not tested sensation intact. Gait not checked.  PSYCHIATRIC: The patient is intubated and sedated SKIN: No obvious rash, lesion, or ulcer.    Labs/imaging that I havepersonally reviewed  (right click and "Reselect all SmartList Selections" daily)     Labs   CBC: Recent Labs  Lab 2021/11/24 1122  WBC 5.2  NEUTROABS 4.1  HGB 12.9*  HCT 38.3*  MCV 85.7  PLT 96*    Basic Metabolic Panel: Recent Labs  Lab 24-Nov-2021 1122 11-24-21 1340  NA 117* 118*  K 2.8* 2.6*  CL 87* 91*  CO2 21* 19*  GLUCOSE 144* 107*  BUN 41* 38*  CREATININE 1.02 0.95  CALCIUM 7.8* 6.8*   GFR: Estimated Creatinine Clearance: 121.7 mL/min (by C-G formula based on SCr of 0.95 mg/dL). Recent Labs  Lab 11-24-2021 1121 11/24/2021 1122 11-24-2021 1340 2021-11-24 1410  PROCALCITON  --   --  3.83  --   WBC  --  5.2  --   --   LATICACIDVEN 2.7*  --   --  1.7    Liver Function Tests: Recent Labs  Lab November 24, 2021 1122  AST 58*  ALT 25  ALKPHOS 22*  BILITOT 3.8*  PROT 8.7*  ALBUMIN 3.0*   No results for input(s): LIPASE, AMYLASE in the last 168 hours. No results for input(s): AMMONIA in the last 168 hours.  ABG    Component Value Date/Time   HCO3 21.6 2021-11-24 1112   ACIDBASEDEF 0.8 11-24-21 1112   O2SAT 83.5 November 24, 2021 1112     Coagulation Profile: Recent Labs  Lab 2021/11/24 1122  INR 1.4*    Cardiac Enzymes: No results for input(s): CKTOTAL, CKMB, CKMBINDEX,  TROPONINI in the last 168 hours.  HbA1C: No results found for: HGBA1C  CBG: No results for input(s): GLUCAP in the last 168 hours.  Review of Systems:   UNABLE TO OBTAIN DUE PATIENT IN RESPIRATORY DISTRESS  Past Medical History  He,  has no past medical history on file.   Surgical History   No past surgical history on file.   Social History   reports that he has never smoked. He has never used smokeless tobacco. He reports current alcohol use of about 12.0 standard drinks per week. He reports that he does not use drugs.   Family History   His family history is not on file.   Allergies No Known Allergies   Home Medications  Prior to Admission medications   Not on File  Scheduled Meds:  enoxaparin (LOVENOX) injection  0.5 mg/kg Subcutaneous Q24H   fentaNYL       ketamine HCl       ketamine HCl       midazolam       midazolam       pantoprazole (PROTONIX) IV  40 mg Intravenous QHS   rocuronium bromide  rocuronium bromide       Continuous Infusions:  [START ON 11/10/2021] azithromycin     [START ON 11/10/2021] cefTRIAXone (ROCEPHIN)  IV     lactated ringers     piperacillin-tazobactam     potassium chloride     sodium chloride     sodium chloride Stopped (11/01/2021 1553)   vancomycin 1,000 mg (11/13/2021 1456)   PRN Meds:.docusate sodium, iohexol, polyethylene glycol  Active Hospital Problem list     Assessment & Plan:  Acute Hypoxic Respiratory Failure secondary to CAP and possible pleural effusion of unknown etiology CT angio chest also shows bilateral pulmonary masses concerning for possible pulmonary malignancy -continue ventilator support & lung protective strategies -Wean PEEP & FiO2 as tolerated, maintain SpO2 > 90% -Head of bed elevated 30 degrees, VAP protocol in place -Plateau pressures less than 30 cm H20  -Intermittent chest x-ray & ABG PRN -Daily WUA with SBT as tolerated  -Ensure adequate pulmonary hygiene  -Obtain acid-fast eval for TB,  Fungitell, Aspergillus, strep and Legionella antigen -Follow blood cultures, trend lactate/PCT, monitor fever curve -Budesonide nebs BID, bronchodilators PRN -wean sedation/analgesia for RASS goal 0-1 -Plan for bedside bronchoscopy for further eval  Lactic Acidosis improved Hypokalemia -Monitor I&O's / urinary output -Follow BMP -Ensure adequate renal perfusion -Avoid nephrotoxic agents as able -Replace electrolytes as indicated    Hyponatremia  History of EtOH abuse likely due to beer potomania? -Check cortisol, TSH, serum osmolality, sodium osmolality -Start IVFs with NS with goal serum sodium level not > 10 to 12 mEq per L in the first 24 hours and 18 mEq per L in the first 48 hours -Check serial sodium Q4HR -Nephrology consult if appropriate  EtOH Abuse high risk for withdrawal Average Drinks Per Day 6-12 beers/day Last Drink 12/10 No Hx Seizures / DTs  -Check Volatile Screen (EtOH, Osmol Gap), UTox -Check CMP, INR, Daily BMP+Mg -Banana Bag x1 (1L NS or D5W, 100mg  thiamine, 1mg  folate, 1amp/tab MVI, 3g magnesium sulfate) if not tolerating PO -Daily Thiamine, Folate, MVI once tolerating PO -SW consult for cessation resources once last -PT/OT evaluation for mobility  Abdominal Distension # Ascites?  Cirrhosis ? in  a patient with EtOH abuse -Obtain CT abdomen pelvis -Obtain ammonia level and hepatitis panel  Thrombocytopenia Likely in the setting of EtOH abuse -Monitor for signs and symptoms of bleeding  Best practice:  Diet:  NPO Pain/Anxiety/Delirium protocol (if indicated): Yes (RASS goal -1) VAP protocol (if indicated): Yes DVT prophylaxis: Contraindicated GI prophylaxis: PPI Glucose control:  SSI No Central venous access:  N/A Arterial line:  N/A Foley:  Yes, and it is still needed Mobility:  bed rest  PT consulted: N/A Last date of multidisciplinary goals of care discussion [11/12] Code Status:  full code Disposition: ICU   Due to language barrier, an  interpreter was present during the history-taking and subsequent discussion (and for part of the physical exam) with this patient.  = Goals of Care = Code Status Order: FULL  Primary Emergency Contact , Home Phone: [phone] Wishes to pursue full aggressive treatment and intervention options, including CPR and intubation, but goals of care will be addressed on going with family if that should become necessary.  Critical care time: 45 minutes     Burman Blacksmith, DNP, CCRN, FNP-C, AGACNP-BC Acute Care Nurse Practitioner  [facility] Pager: [phone] [facility]  .

## 2021-11-09 NOTE — ED Provider Notes (Addendum)
[facility] Emergency Department Provider Note   ____________________________________________   Event Date/Time   First MD Initiated Contact with Patient 11/06/2021 1115     (approximate)  I have reviewed the triage vital signs and the nursing notes.   HISTORY  Chief Complaint Chest Pain    HPI Adam White is a 35 y.o. male patient with pleuritic chest pain cough productive of brown phlegm and fever.  He is very short of breath.         History reviewed. No pertinent past medical history.  There are no problems to display for this patient.   No past surgical history on file.  Prior to Admission medications   Not on File    Allergies Patient has no known allergies.  No family history on file.  Social History Social History   Tobacco Use   Smoking status: Never   Smokeless tobacco: Never  Substance Use Topics   Alcohol use: Yes    Alcohol/week: 12.0 standard drinks    Types: 12 Cans of beer per week   Drug use: Never    Review of Systems  Constitutional:  fever/chills Eyes: No visual changes. ENT: No sore throat. Cardiovascular: Pleuritic chest pain. Respiratory: shortness of breath. Gastrointestinal: No abdominal pain.  No nausea, no vomiting.  No diarrhea.  No constipation. Genitourinary: Negative for dysuria. Musculoskeletal: Negative for back pain. Skin: Negative for rash. Neurological: Negative for headaches, focal weakness or numbness.  ____________________________________________   PHYSICAL EXAM:  VITAL SIGNS: ED Triage Vitals  Enc Vitals Group     BP 11/20/2021 1103 126/76     Pulse Rate 11/17/2021 1103 (!) 140     Resp 11/22/2021 1103 (!) 31     Temp 11/11/2021 1103 (!) 102.8 F (39.3 C)     Temp Source 11/28/2021 1103 Axillary     SpO2 11/27/2021 1103 (!) 88 %     Weight 11/12/2021 1107 218 lb 0.6 oz (98.9 kg)     Height 10/30/2021 1107 5\' 6"  (1.676 m)     Head Circumference --      Peak Flow --       Pain Score 11/02/2021 1106 10     Pain Loc --      Pain Edu? --      Excl. in [place]? --     Constitutional: Alert and oriented.  Ill-appearing and in respiratory distress Eyes: Conjunctivae are normal.  Head: Atraumatic. Nose: No congestion/rhinnorhea. Mouth/Throat: Mucous membranes are moist. Neck: No stridor.  Cardiovascular: Normal rate, regular rhythm. Grossly normal heart sounds.  Good peripheral circulation. Respiratory: Increased respiratory effort.  retractions. Lungs difficult to hear but appear to be crackles in the right lower lung base Gastrointestinal: Soft and nontender. No distention. No abdominal bruits. No CVA tenderness. Musculoskeletal: No lower extremity tenderness nor edema.  Neurologic:  Normal speech and language. No gross focal neurologic deficits are appreciated.  Skin:  Skin is warm, dry and intact. No rash noted.   ____________________________________________   LABS (all labs ordered are listed, but only abnormal results are displayed)  Labs Reviewed  PROTIME-INR - Abnormal; Notable for the following components:      Result Value   Prothrombin Time 17.3 (*)    INR 1.4 (*)    All other components within normal limits  LACTIC ACID, PLASMA - Abnormal; Notable for the following components:   Lactic Acid, Venous 2.7 (*)    All other components within normal limits  CBC WITH DIFFERENTIAL/PLATELET -  Abnormal; Notable for the following components:   Hemoglobin 12.9 (*)    HCT 38.3 (*)    RDW 15.9 (*)    Platelets 96 (*)    nRBC 0.6 (*)    Abs Immature Granulocytes 0.10 (*)    All other components within normal limits  COMPREHENSIVE METABOLIC PANEL - Abnormal; Notable for the following components:   Sodium 117 (*)    Potassium 2.8 (*)    Chloride 87 (*)    CO2 21 (*)    Glucose, Bld 144 (*)    BUN 41 (*)    Calcium 7.8 (*)    Total Protein 8.7 (*)    Albumin 3.0 (*)    AST 58 (*)    Alkaline Phosphatase 22 (*)    Total Bilirubin 3.8 (*)    All other  components within normal limits  BLOOD GAS, VENOUS - Abnormal; Notable for the following components:   pH, Ven 7.48 (*)    pCO2, Ven 29 (*)    All other components within normal limits  APTT - Abnormal; Notable for the following components:   aPTT 39 (*)    All other components within normal limits  RESP PANEL BY RT-PCR (FLU A&B, COVID) ARPGX2  CULTURE, BLOOD (ROUTINE X 2)  CULTURE, BLOOD (ROUTINE X 2)  URINE CULTURE  LACTIC ACID, PLASMA  PROCALCITONIN  URINALYSIS, COMPLETE (UACMP) WITH MICROSCOPIC  BASIC METABOLIC PANEL  TROPONIN I (HIGH SENSITIVITY)  TROPONIN I (HIGH SENSITIVITY)   ____________________________________________  EKG  EKG read interpreted by me shows sinus tachycardia rate of 141 severe right axis deviation no acute ST-T wave changes are seen ____________________________________________  RADIOLOGY Gertha Calkin, personally viewed and evaluated these images (plain radiographs) as part of my medical decision making, as well as reviewing the written report by the radiologist.  ED MD interpretation: X-ray reviewed by me appears to show bilateral infiltrate.  We will get the flu test back soon as possible.  Almost looks like there is some nodules in the left side of the lung possibly staph we will add vancomycin.  Official radiology report(s): DG Chest Portable 1 View  Result Date: 11/26/2021 CLINICAL DATA:  Shortness of breath. EXAM: PORTABLE CHEST 1 VIEW COMPARISON:  None. FINDINGS: The heart size and mediastinal contours are within normal limits. Rounded density is seen in right lower lobe concerning for mass or nodule. Nodular opacities are also seen in the left perihilar region. Left pleural effusion may be present as well. The visualized skeletal structures are unremarkable. IMPRESSION: Rounded density is noted in right lower lobe concerning for mass or nodule. Nodular opacities are seen in the left midlung. Some degree of left pleural effusion is noted as  well. CT scan of the chest is recommended for further evaluation. Electronically Signed   By: Marijo Conception M.D.   On: 11/25/2021 11:48    ____________________________________________   PROCEDURES  Procedure(s) performed (including Critical Care): Critical care time 1.5 hours.  This includes seeing the patient in triage and speaking with him examining him there in the presence of his wife and the translator and the nurse.  Then we moved him my self helping to room 6 where I examined him again with his wife and new nurses and the translator.  I have also reviewed his past history and his lab work and x-rays and other studies.  I also spent a good deal of time titrating his oxygen with the help of the respiratory therapist.  We found the patient could not tolerate BiPAP even with some sedation and had to go with high flow heated nasal cannula.  speaking with the hospitalist also took time.  Hospitalist has asked me to paged the ICU doctor as patient is still very tachycardic.  CT was read by radiology as being suspicious for malignancy most likely lymphoma.  Procalcitonin is still pending at this time. Please note: Preparation for and performance of an aftercare for intubation added another 20 minutes to the above critical care time this is especially since we push to ketamine slowly.  Ketamine was pushed slowly to avoid interrupting respiratory drive. Procedures   ____________________________________________   INITIAL IMPRESSION / ASSESSMENT AND PLAN / ED COURSE  PT PTT were ordered because apparently patient was thought to be taking Coumadin but when I asked and myself that he said now and is not taking any other blood thinners are not sure what the correct answer is for this. Patient's wife reports he is not drinking as much beer as he had been.  Patient in fact reports he is not taking any medicine and his old records bear this out.  He was unable to tolerate the BiPAP so we have transferred  him to high flow O2.  100% nonrebreather was only keeping him at 90 to 91%.   ----------------------------------------- 12:41 PM on 12/03/21 ----------------------------------------- Radiology wants a CT to evaluate numerous densities in the patient's chest.  I do need to add that the right upper lobe density was part of his BiPAP filter.  The left and right lower lobe densities however are intrapulmonary. ----------------------------------------- 1:11 PM on 12/03/21 ----------------------------------------- Patient went for CT.  Patient has too much respiratory artifact to get a good clear CT reading however there does not appear to be any abscess at least and no obvious tumor to me will await the radiologist reading of course but I did in the meantime I will get him in the hospital.  We will repeat his BMP shortly to see if we have made any progress with his sodium and potassium etc.   ----------------------------------------- 1:31 PM on 2021-12-03 ----------------------------------------- Spoken to the hospitalist and we are going to draw the second BNP in a minute or 2. ----------------------------------------- 2:04 PM on 03-Dec-2021 ----------------------------------------- Hospitalist wants me to call ICU is patient still very tachycardic.  I have asked for them to be paged.  CT has been read as possible lymphoma.  Patient still tachycardic tachypneic but not as much of either.  Heart rate is down to 129 now.  Still has 102.8 fever. ----------------------------------------- 2:25 PM on 12/03/21 -----------------------------------------  Discussed with ICU doctor.  They are going to come and see him.  They asked for more antibiotics suggest Zosyn.  I have done this.  Second set of lites is pending. ____________________________________________   FINAL CLINICAL IMPRESSION(S) / ED DIAGNOSES  Final diagnoses:  Hyponatremia  Hypokalemia  Tachycardia  Sepsis with acute organ  dysfunction and septic shock, due to unspecified organism, unspecified type (HCC)  Community acquired pneumonia, unspecified laterality  Elevated lactic acid level     ED Discharge Orders     None        Note:  This document was prepared using Dragon voice recognition software and may include unintentional dictation errors.    Arnaldo Natal, MD 2021/12/03 1311    Arnaldo Natal, MD 12-03-21 1316    Arnaldo Natal, MD 03-Dec-2021 1426 At request of ICU doctor patient was intubated.  Consent  was obtained  using interpreter.  Patient was sedated with 2 of Versed and then given 50 of ketamine 10 mg at a time although the last 20 were given at once.  Patient then was given another 2 of Versed and was given more ketamine.  He became very sleepy and we gave him the rocuronium.  Patient was attempted to intubate with #8 ET tube and glide scope.  ET tube was hung up in vallecula had to pull it out and bagged him this was done twice and he was intubated successfully patient doing well ET tube visualized going through the cords bilateral breath sounds and good color change I then placed a NG tube after measuring it.  NG tube placement was checked by appearing gurgles in the stomach.  Chest x-ray showed end of ET tube about 1 and half centimeter above carina 25 to the lips and showed the NG tube going below the diaphragm into the stomach although the end of the NG tube was not visualized.  Patient is stable sats are at about 90% blood pressure is 0000000 systolic and heart rate is 130-140.   Nena Polio, MD 11/20/2021 [phone]

## 2021-11-09 NOTE — Plan of Care (Signed)
MICU Attestation  Patient seen and examined and relevant ancillary tests reviewed.  I agree with the assessment and plan of care as outlined by Rufina Falco, NP. This patient was not seen as a shared visit. The following reflects my independent critical care time for 11/28/2021.  Mr. Adam White is a 35 y/o [place] American male presenting with fever, productive cough, pleurisy and hypoxia. He did poorly on BiPAP and was transitioned to [facility] with continued increased work of breathing. He was started on empiric antibiotics and sepsis protocol was initiated. CT chest showed bilateral nodular opacities with a masslike opacity in the right lower lobe along with complete atelectasis of the left lower lobe. The decision was made to intubate the patient for increased work of breathing and for bronchoscopy. Labs notable for hyponatremia, hypokalemia, thrombocytopenia and elevated Tbili.  Blood pressure (!) 159/90, pulse (!) 139, temperature 99.3 F (37.4 C), temperature source Axillary, resp. rate 17, height _0  (1.676 m), weight 98.9 kg, SpO2 93 %.   Intake/Output Summary (Last 24 hours) at 11/08/2021 1646 Last data filed at 11/13/2021 1349 Gross per 24 hour  Intake 350 ml  Output --  Net 350 ml    Vent Mode: AC FiO2 (%):  [60 %-100 %] 100 % Set Rate:  [18 bmp-22 bmp] 22 bmp Vt Set:  [480 mL] 480 mL PEEP:  [12 cmH20] 12 cmH20  On exam he is on HHFNC with increased work of breathing. Diffuse crackles are present bilaterally. He is tachycardic. Abdomen is distended. Mild jaundice.  Impression/Plan: Acute hypoxic respiratory failure Abnormal CT chest with concern for atypical infection versus malignancy Hyponatremia Hypokalemia Elevated LFTs Lactic acidosis  - Maintain on mechanical ventilation per ARDSNet protocol - Target 6 cc/kg IBW; pH>7.20 - RASS goal -4 for now given significant hypoxia - Will perform bedside bronchoscopy for BAL and to assess for obstruction - Send BAL for  cell count/diff, bacterial, fungal, AFB cultures and cytology - Send urine histo, blasto, serum Fungitell and Aspergillus - Continue broad spectrum antibiotics; f/u culture data - Continuous normal saline; sodium checks q4 - Goal Na correction 124-126 in first 24 hours - Replete lytes - Obtain RUQ U/S, hepatitis panel - Trend lactate to close  This patient is critically ill with multiple organ system failure; which, requires frequent high complexity decision making, assessment, support, evaluation, and titration of therapies. This was completed through the application of advanced monitoring technologies and extensive interpretation of multiple databases. During this encounter critical care time was devoted to patient care services described in this note for 60 minutes.  Bennie Pierini, MD 11/24/2021 4:46 PM

## 2021-11-09 NOTE — Plan of Care (Signed)
Continuing with plan of care. 

## 2021-11-09 NOTE — ED Notes (Signed)
Pt transported to CT by this RN and Respiratory. Pt then transported to ICU room 20.

## 2021-11-09 NOTE — Care Management Note (Signed)
Called to admit pt 36 year old Hispanic male being admitted for multifocal pneumonia hypoxia and respiratory distress with tachypnea of 35 to 45 minutes, tachycardia, hyponatremia of 117 potassium was 2.8, CTA did not show any lung abscess but bibasilar opacities more on the left  malignancy. Consider LYMPHOMA Due to his critical condition ICU admission was advised. EDP to call  ICU for admission

## 2021-11-09 NOTE — Consult Note (Signed)
Pharmacy Antibiotic Note  Adam White is a 35 y.o. male with medical history including alcohol use disorder admitted on 11/07/2021 with pneumonia. Patient with acute respiratory failure requiring intubation 12/11. Vitals on admission notable for Tmax 102.8, tachycardia, tachypnea. Labs notable for normal WBC with mild left shift on differential. PCT 3.83. CTA impression of bilateral pulmonary masses with associated left effusion concerning for malignancy with infectious process or granulomatous disease less favored. Pharmacy has been consulted for vancomycin and Zosyn dosing.  Plan:  Zosyn 3.375 g IV q8h  Vancomycin 2 g IV LD followed by maintenance regimen of vancomycin 1 g IV q12h --Calculated AUC: 463, Cmin 11.9 --Daily Scr per protocol --Levels at steady state as clinically indicated  Height: _0  (167.6 cm) Weight: 98.9 kg (218 lb 0.6 oz) IBW/kg (Calculated) : 63.8  Temp (24hrs), Avg:101.1 F (38.4 C), Min:99.3 F (37.4 C), Max:102.8 F (39.3 C)  Recent Labs  Lab 11/28/2021 1121 11/06/2021 1122 11/04/2021 1340 11/21/2021 1410  WBC  --  5.2  --   --   CREATININE  --  1.02 0.95  --   LATICACIDVEN 2.7*  --   --  1.7    Estimated Creatinine Clearance: 121.7 mL/min (by C-G formula based on SCr of 0.95 mg/dL).    No Known Allergies  Antimicrobials this admission: Azithromycin 12/11 x 1 Ceftriaxone 12/11 x 1 Zosyn 12/11 >>  Vancomycin 12/11 >>   Dose adjustments this admission: N/A  Microbiology results: 12/11 BCx: pending 12/11 UCx: pending  12/11 Sputum: pending  12/11 MRSA PCR: pending 12/11 Aspergillus BAL / serum: pending 12/11 Blastomyces Ag: pending 12/11 Fungitell: pending 12/11 Histoplasma Ag: pending 12/11 Legionella urinary Ag: pending 12/11 Strep pneumo urinary Ag: pending 12/11 Quantiferon: pending 12/11 HIV screen: pending  Thank you for allowing pharmacy to be a part of this patient's care.  Benita Gutter 11/05/2021 4:10 PM

## 2021-11-09 NOTE — Progress Notes (Signed)
RT called to place patient on Bipap for respiratory distress.  Patient unable to tolerate despite down-titrating pressures and IV Ativan.  Given reassuring VBG, discussed possibility of HFNC with Dr. Darnelle Catalan.  Verbal order given.  Pt placed on 50lpm/60% with good tolerance.  Will continue to monitor.

## 2021-11-10 ENCOUNTER — Inpatient Hospital Stay: Payer: Medicaid Other

## 2021-11-10 ENCOUNTER — Encounter: Payer: Self-pay | Admitting: Internal Medicine

## 2021-11-10 DIAGNOSIS — J189 Pneumonia, unspecified organism: Secondary | ICD-10-CM

## 2021-11-10 DIAGNOSIS — R7881 Bacteremia: Secondary | ICD-10-CM

## 2021-11-10 DIAGNOSIS — A419 Sepsis, unspecified organism: Secondary | ICD-10-CM

## 2021-11-10 DIAGNOSIS — J9601 Acute respiratory failure with hypoxia: Secondary | ICD-10-CM

## 2021-11-10 DIAGNOSIS — B953 Streptococcus pneumoniae as the cause of diseases classified elsewhere: Secondary | ICD-10-CM

## 2021-11-10 DIAGNOSIS — R6521 Severe sepsis with septic shock: Secondary | ICD-10-CM

## 2021-11-10 LAB — COMPREHENSIVE METABOLIC PANEL
ALT: 21 U/L (ref 0–44)
AST: 52 U/L — ABNORMAL HIGH (ref 15–41)
Albumin: 2.3 g/dL — ABNORMAL LOW (ref 3.5–5.0)
Alkaline Phosphatase: 26 U/L — ABNORMAL LOW (ref 38–126)
Anion gap: 7 (ref 5–15)
BUN: 34 mg/dL — ABNORMAL HIGH (ref 6–20)
CO2: 22 mmol/L (ref 22–32)
Calcium: 7.3 mg/dL — ABNORMAL LOW (ref 8.9–10.3)
Chloride: 95 mmol/L — ABNORMAL LOW (ref 98–111)
Creatinine, Ser: 0.89 mg/dL (ref 0.61–1.24)
GFR, Estimated: >60 mL/min (ref >60)
Glucose, Bld: 148 mg/dL — ABNORMAL HIGH (ref 70–99)
Potassium: 3.4 mmol/L — ABNORMAL LOW (ref 3.5–5.1)
Sodium: 124 mmol/L — ABNORMAL LOW (ref 135–145)
Total Bilirubin: 2.1 mg/dL — ABNORMAL HIGH (ref 0.3–1.2)
Total Protein: 7.6 g/dL (ref 6.5–8.1)

## 2021-11-10 LAB — POTASSIUM
Potassium: 3.5 mmol/L (ref 3.5–5.1)
Potassium: 3.5 mmol/L (ref 3.5–5.1)

## 2021-11-10 LAB — BLOOD CULTURE ID PANEL (REFLEXED) - BCID2

## 2021-11-10 LAB — BLOOD GAS, ARTERIAL
Acid-base deficit: 4.1 mmol/L — ABNORMAL HIGH (ref 0.0–2.0)
Bicarbonate: 22.6 mmol/L (ref 20.0–28.0)
FIO2: 0.8
MECHVT: 400 mL
O2 Saturation: 97.3 %
PEEP: 10 cmH2O
Patient temperature: 37
RATE: 30 resp/min
pCO2 arterial: 47 mmHg (ref 32.0–48.0)
pH, Arterial: 7.29 — ABNORMAL LOW (ref 7.350–7.450)
pO2, Arterial: 104 mmHg (ref 83.0–108.0)

## 2021-11-10 LAB — CBC
HCT: 35.3 % — ABNORMAL LOW (ref 39.0–52.0)
Hemoglobin: 11.5 g/dL — ABNORMAL LOW (ref 13.0–17.0)
MCH: 29.3 pg (ref 26.0–34.0)
MCHC: 32.6 g/dL (ref 30.0–36.0)
MCV: 90.1 fL (ref 80.0–100.0)
Platelets: 99 10*3/uL — ABNORMAL LOW (ref 150–400)
RBC: 3.92 MIL/uL — ABNORMAL LOW (ref 4.22–5.81)
RDW: 16.6 % — ABNORMAL HIGH (ref 11.5–15.5)
WBC: 9.5 10*3/uL (ref 4.0–10.5)
nRBC: 0.3 % — ABNORMAL HIGH (ref 0.0–0.2)

## 2021-11-10 LAB — SODIUM
Sodium: 121 mmol/L — ABNORMAL LOW (ref 135–145)
Sodium: 129 mmol/L — ABNORMAL LOW (ref 135–145)
Sodium: 130 mmol/L — ABNORMAL LOW (ref 135–145)
Sodium: 132 mmol/L — ABNORMAL LOW (ref 135–145)

## 2021-11-10 LAB — HIV ANTIBODY (ROUTINE TESTING W REFLEX): HIV Screen 4th Generation wRfx: NONREACTIVE

## 2021-11-10 LAB — HEPATITIS PANEL, ACUTE
HCV Ab: NONREACTIVE
Hep A IgM: NONREACTIVE
Hep B C IgM: NONREACTIVE
Hepatitis B Surface Ag: NONREACTIVE

## 2021-11-10 LAB — URINE CULTURE: Culture: NO GROWTH

## 2021-11-10 LAB — MAGNESIUM: Magnesium: 2.6 mg/dL — ABNORMAL HIGH (ref 1.7–2.4)

## 2021-11-10 LAB — PHOSPHORUS: Phosphorus: 3.5 mg/dL (ref 2.5–4.6)

## 2021-11-10 MED ORDER — HEPARIN (PORCINE) 25000 UT/250ML-% IV SOLN
1500.0000 [IU]/h | INTRAVENOUS | Status: DC
  Administered 2021-11-10: 1500 [IU]/h via INTRAVENOUS
  Filled 2021-11-10: qty 250

## 2021-11-10 MED ORDER — VITAL HIGH PROTEIN PO LIQD
1000.0000 mL | ORAL | Status: DC
  Administered 2021-11-10 – 2021-11-13 (×5): 1000 mL

## 2021-11-10 MED ORDER — NOREPINEPHRINE 16 MG/250ML-% IV SOLN
0.0000 ug/min | INTRAVENOUS | Status: DC
  Administered 2021-11-10: 14 ug/min via INTRAVENOUS
  Administered 2021-11-11: 2 ug/min via INTRAVENOUS
  Administered 2021-11-12: 21:00:00 40 ug/min via INTRAVENOUS
  Administered 2021-11-12: 12:00:00 28 ug/min via INTRAVENOUS
  Administered 2021-11-13: 40 ug/min via INTRAVENOUS
  Administered 2021-11-13: 80 ug/min via INTRAVENOUS
  Administered 2021-11-13: 150 ug/min via INTRAVENOUS
  Administered 2021-11-13: 40 ug/min via INTRAVENOUS
  Administered 2021-11-14 (×5): 200 ug/min via INTRAVENOUS
  Filled 2021-11-10 (×15): qty 250

## 2021-11-10 MED ORDER — FOLIC ACID 1 MG PO TABS
1.0000 mg | ORAL_TABLET | Freq: Every day | ORAL | Status: DC
  Administered 2021-11-11 – 2021-11-13 (×3): 1 mg
  Filled 2021-11-10 (×3): qty 1

## 2021-11-10 MED ORDER — THIAMINE HCL 100 MG/ML IJ SOLN
100.0000 mg | INTRAMUSCULAR | Status: DC
  Administered 2021-11-12 – 2021-11-13 (×2): 100 mg via INTRAVENOUS
  Filled 2021-11-10 (×2): qty 2

## 2021-11-10 MED ORDER — SODIUM CHLORIDE 0.9 % IV SOLN
2.0000 g | INTRAVENOUS | Status: DC
  Administered 2021-11-10: 2 g via INTRAVENOUS
  Filled 2021-11-10: qty 20
  Filled 2021-11-10: qty 2

## 2021-11-10 MED ORDER — ALBUMIN HUMAN 25 % IV SOLN
25.0000 g | Freq: Four times a day (QID) | INTRAVENOUS | Status: AC
  Administered 2021-11-10 (×2): 25 g via INTRAVENOUS
  Filled 2021-11-10 (×2): qty 100

## 2021-11-10 MED ORDER — FREE WATER
30.0000 mL | Status: DC
  Administered 2021-11-10 – 2021-11-14 (×22): 30 mL

## 2021-11-10 MED ORDER — POTASSIUM CHLORIDE 10 MEQ/50ML IV SOLN
10.0000 meq | INTRAVENOUS | Status: AC
  Administered 2021-11-10 (×2): 10 meq via INTRAVENOUS
  Filled 2021-11-10 (×2): qty 50

## 2021-11-10 MED ORDER — POTASSIUM CHLORIDE 10 MEQ/50ML IV SOLN
10.0000 meq | INTRAVENOUS | Status: AC
  Administered 2021-11-10 (×4): 10 meq via INTRAVENOUS
  Filled 2021-11-10 (×4): qty 50

## 2021-11-10 MED ORDER — THIAMINE HCL 100 MG/ML IJ SOLN
500.0000 mg | Freq: Three times a day (TID) | INTRAVENOUS | Status: AC
  Administered 2021-11-10 – 2021-11-11 (×6): 500 mg via INTRAVENOUS
  Filled 2021-11-10 (×7): qty 5

## 2021-11-10 MED ORDER — HEPARIN BOLUS VIA INFUSION
4000.0000 [IU] | Freq: Once | INTRAVENOUS | Status: AC
  Administered 2021-11-10: 4000 [IU] via INTRAVENOUS
  Filled 2021-11-10: qty 4000

## 2021-11-10 MED ORDER — ADULT MULTIVITAMIN W/MINERALS CH
1.0000 | ORAL_TABLET | Freq: Every day | ORAL | Status: DC
  Administered 2021-11-11 – 2021-11-13 (×3): 1
  Filled 2021-11-10 (×3): qty 1

## 2021-11-10 MED ORDER — PROSOURCE TF PO LIQD
90.0000 mL | Freq: Four times a day (QID) | ORAL | Status: DC
  Administered 2021-11-10 – 2021-11-13 (×13): 90 mL
  Filled 2021-11-10 (×17): qty 90

## 2021-11-10 NOTE — Procedures (Signed)
Arterial Catheter Insertion Procedure Note  Reynard Christoffersen  [id number]  04-02-1986  Date:11/10/21  Time:12:15 AM    Provider Performing: Cecelia Byars [place]    Procedure: Insertion of Arterial Line (55208) with US guidance (02233)   Indication(s) Blood pressure monitoring and/or need for frequent ABGs  Consent Risks of the procedure as well as the alternatives and risks of each were explained to the patient and/or caregiver.  Consent for the procedure was obtained and is signed in the bedside chart  Anesthesia Propofol & fentanyl drips infusing   Time Out Verified patient identification, verified procedure, site/side was marked, verified correct patient position, special equipment/implants available, medications/allergies/relevant history reviewed, required imaging and test results available.   Sterile Technique Maximal sterile technique including full sterile barrier drape, hand hygiene, sterile gown, sterile gloves, mask, hair covering, sterile ultrasound probe cover (if used).   Procedure Description Area of catheter insertion was cleaned with chlorhexidine and draped in sterile fashion. With real-time ultrasound guidance an arterial catheter was placed into the right radial artery.  Appropriate arterial tracings confirmed on monitor.     Complications/Tolerance None; patient tolerated the procedure well.   EBL Minimal   Specimen(s) None   Betsey Holiday, AGACNP-BC Acute Care Nurse Practitioner [facility]   [phone] / [phone] Please see Amion for pager details.

## 2021-11-10 NOTE — Consult Note (Signed)
ANTICOAGULATION CONSULT NOTE  Pharmacy Consult for IV Heparin Indication:  Portal vein thrombosis  Patient Measurements: Height: 5' 5.98" (167.6 cm) Weight: 108.5 kg (239 lb 3.2 oz) IBW/kg (Calculated) : 63.76 Heparin Dosing Weight: 88.5 kg  Labs: Recent Labs    11/24/2021 1122 11/20/2021 1230 11/25/2021 1340 11/10/21 0432  HGB 12.9*  --   --  11.5*  HCT 38.3*  --   --  35.3*  PLT 96*  --   --  99*  APTT  --  39*  --   --   LABPROT 17.3*  --   --   --   INR 1.4*  --   --   --   CREATININE 1.02  --  0.95 0.89  TROPONINIHS 10  --  10  --     Estimated Creatinine Clearance: 136.4 mL/min (by C-G formula based on SCr of 0.89 mg/dL).   Medical History: History reviewed. No pertinent past medical history.  Medications:  No anticoagulation prior to admission per my chart review  Assessment: Patient is a 35 y/o M with medical history including EtOH use disorder and thrombocytopenia who is admitted with acute respiratory failure secondary to CAP / pleural effusion. RUQ ultrasound performed 12/12 showed portal vein thrombosis. Pharmacy consulted to initiate heparin infusion for portal vein thrombosis.  CBC notable for mild anemia and mild stable thrombocytopenia. Baseline INR 1.4, aPTT 39 (both slightly elevated likely secondary to liver disease)  Goal of Therapy:  Heparin level 0.3-0.7 units/ml Monitor platelets by anticoagulation protocol: Yes   Plan:  --Heparin 4000 unit IV bolus followed by continuous infusion at 1500 units/hr --Heparin level 6 hours after initiation of infusion --Daily CBC per protocol while on IV heparin  Tressie Ellis 11/10/2021,4:42 PM

## 2021-11-10 NOTE — Consult Note (Addendum)
PHARMACY CONSULT NOTE  Pharmacy Consult for Electrolyte Monitoring and Replacement   Recent Labs: Potassium (mmol/L)  Date Value  11/10/2021 3.4 (L)   Magnesium (mg/dL)  Date Value  62/22/9798 2.6 (H)   Calcium (mg/dL)  Date Value  92/09/9416 7.3 (L)   Albumin (g/dL)  Date Value  40/81/4481 2.3 (L)   Phosphorus (mg/dL)  Date Value  85/63/1497 3.5   Sodium (mmol/L)  Date Value  11/10/2021 124 (L)    Assessment: Patient is a 35 y/o M with history of alcohol use disorder who presented to the ED 12/11 with pleuritic chest pain. CTA interpretation with bilateral pulmonary masses and associated left effusion concerning for malignancy with infectious process or granulomatous disease felt to be less likely. Appears patient was intubated in the ED. Pharmacy consulted to assist with electrolyte monitoring and replacement as indicated.  Labs on 12/12 Na 121 (0030) > 124 (0430) > 129 (1030)  K 3.4 Phos 3.5 Mg 2.6  MIVF: NS @ 75mL/hr since 12/11 >> stopped on 12/12 d/t correction of 27mEq/24h  Goal of Therapy:  Electrolytes within normal limits  Plan:  --Na trending appropriately, but will stop current NS based on desired rate of correction. --K noted 3.5>3.4, however has only received 2 of 4 IV doses on active order.  Will not order additional replacement at this time --Will continue to monitor electrolytes and f/u with AM labs  Doloris Hall, PharmD Pharmacy Resident  11/10/2021 11:56 AM  Cosigner:  Martyn Malay, PharmD, [facility] Clinical Pharmacist

## 2021-11-10 NOTE — Progress Notes (Signed)
Initial Nutrition Assessment  DOCUMENTATION CODES:   Obesity unspecified  INTERVENTION:   Vital HP @20ml /hr + ProSource TF 73ml QID via tube   Propofol: 35.6 ml/hr- provides 940kcal/day   Free water flushes 57ml q4 hours to maintain tube patency   Regimen provides 1740kcal/day, 130g/day protein and 541ml/day free water   MVI and folic acid daily via tube   Pt at high refeed risk; recommend monitor potassium, magnesium and phosphorus labs daily until stable  NUTRITION DIAGNOSIS:   Inadequate oral intake related to inability to eat (pt sedated and ventilated) as evidenced by NPO status.  GOAL:   Patient will meet greater than or equal to 90% of their needs  MONITOR:   Labs, Vent status, Weight trends, TF tolerance, Skin, I & O's  REASON FOR ASSESSMENT:   Ventilator    ASSESSMENT:   35 y/o male with h/o etoh abuse who is admitted with cirrhosis, ascites and CAP.  Pt sedated and ventilated. OGT in place. Plan is to start tube feeds today. Pt is at high refeed risk secondary to etoh abuse. Per chart, pt up ~22lbs from his UBW; pt is noted to have ascites.   Medications reviewed and include: colace, protonix, miralax, thiamine, ceftriaxone, levophed, propofol  Labs reviewed: Na 130(L), K 3.5 wnl, BUN 34(H), P 3.5 wnl, Mg 2.6(H)  Patient is currently intubated on ventilator support MV: 12.3 L/min Temp (24hrs), Avg:99.2 F (37.3 C), Min:97.5 F (36.4 C), Max:99.9 F (37.7 C)  Propofol: 35.6 ml/hr- provides 940kcal/day   MAP- >8mmHg   UOP- 3070ml/day   NUTRITION - FOCUSED PHYSICAL EXAM:  Flowsheet Row Most Recent Value  Orbital Region No depletion  Upper Arm Region No depletion  Thoracic and Lumbar Region No depletion  Buccal Region No depletion  Temple Region No depletion  Clavicle Bone Region No depletion  Clavicle and Acromion Bone Region No depletion  Scapular Bone Region No depletion  Dorsal Hand No depletion  Patellar Region No depletion   Anterior Thigh Region No depletion  Posterior Calf Region No depletion  Edema (RD Assessment) Mild  Hair Reviewed  Eyes Reviewed  Mouth Reviewed  Skin Reviewed  Nails Reviewed   Diet Order:   Diet Order             Diet NPO time specified  Diet effective now                  EDUCATION NEEDS:   No education needs have been identified at this time  Skin:  Skin Assessment: Reviewed RN Assessment  Last BM:  12/12  Height:   Ht Readings from Last 1 Encounters:  11/10/21 5' 5.98" (1.676 m)    Weight:   Wt Readings from Last 1 Encounters:  11/10/21 108.5 kg    Ideal Body Weight:  64.5 kg  BMI:  Body mass index is 38.63 kg/m.  Estimated Nutritional Needs:   Kcal:  1200-1519kcal/day  Protein:  >129g/day  Fluid:  1.9-2.2L/day  14/12/22 MS, RD, LDN Please refer to [facility] for RD and/or RD on-call/weekend/after hours pager

## 2021-11-10 NOTE — Consult Note (Signed)
NAME: Adam White  DOB: [birth year]  MRN: [id number]  Date/Time: 11/10/2021 8:26 PM  REQUESTING PROVIDER: Dr.Kasa Subjective:  REASON FOR CONSULT: Strep pneumo bacteremia ?No history from patient as he is intubated- chart reviewed- spoke to wife at bed side Adam White is a 35 y.o. with a history of Presented  to the ED with pleuritic chest pain, cough productive of brown sputum, fever and sob of a few days duration  Vitals 102.8, pulse 140, BP 126/76, sats 88% Labs revealed sodium of 117, potassium 2.8, glucose 144, creatinine 1.02, calcium 7.8.  WBC was 5.2 and Hb 12.9 and platelets 96. Blood culture sent.  Patient was started on IV vancomycin, relaxant and azithromycin. Chest x-ray revealed 7 p bilateral infiltrates and CT scan showed nodular lesions in both the lobes.  Concerning for infection versus lymphoma.  patient initially in the ED was on nonrebreather.  High flow nasal cannula oxygen and later had to be intubated and sent to the ICU. He underwent bronchoscopy on 01/10/2021.  Am seeing the patient has blood cultures positive for strep pneumo.  Past medical history none as per his wife Past surgical history none Family history none available.  Social History   Socioeconomic History   Marital status: Married    Spouse name: Not on file   Number of children: Not on file   Years of education: Not on file   Highest education level: Not on file  Occupational History   Not on file  Tobacco Use   Smoking status: Never   Smokeless tobacco: Never  Substance and Sexual Activity   Alcohol use: Yes    Alcohol/week: 12.0 standard drinks    Types: 12 Cans of beer per week   Drug use: Never   Sexual activity: Not on file  Other Topics Concern   Not on file  Social History Narrative   Not on file   Social Determinants of Health   Financial Resource Strain: Not on file  Food Insecurity: Not on file  Transportation Needs: Not on file  Physical  Activity: Not on file  Stress: Not on file  Social Connections: Not on file  Intimate Partner Violence: Not on file     No Known Allergies I? Current Facility-Administered Medications  Medication Dose Route Frequency Provider Last Rate Last Admin   0.9 %  sodium chloride infusion  250 mL Intravenous Continuous Rust-Chester, Huel Cote, NP   Held at 11/16/2021 2127   cefTRIAXone (ROCEPHIN) 2 g in sodium chloride 0.9 % 100 mL IVPB  2 g Intravenous Q24H Flora Lipps, MD   Stopped at 11/10/21 1506   chlorhexidine gluconate (MEDLINE KIT) (PERIDEX) 0.12 % solution 15 mL  15 mL Mouth Rinse BID Bennie Pierini, MD   15 mL at 11/10/21 2008   Chlorhexidine Gluconate Cloth 2 % PADS 6 each  6 each Topical Daily Bennie Pierini, MD   6 each at 11/10/21 1400   docusate (COLACE) 50 MG/5ML liquid 100 mg  100 mg Per Tube BID Bennie Pierini, MD   100 mg at 11/10/21 1015   docusate sodium (COLACE) capsule 100 mg  100 mg Oral BID PRN Lang Snow, NP       feeding supplement (PROSource TF) liquid 90 mL  90 mL Per Tube QID Flora Lipps, MD       feeding supplement (VITAL HIGH PROTEIN) liquid 1,000 mL  1,000 mL Per Tube Q24H Flora Lipps, MD   1,000 mL at 11/10/21 1657  fentaNYL (SUBLIMAZE) bolus via infusion 50-100 mcg  50-100 mcg Intravenous Q15 min PRN Bennie Pierini, MD   50 mcg at 11/24/2021 1850   fentaNYL (SUBLIMAZE) injection 50 mcg  50 mcg Intravenous Once Bennie Pierini, MD       fentaNYL (SUBLIMAZE) injection 50 mcg  50 mcg Intravenous Q15 min PRN Bennie Pierini, MD   50 mcg at 11/10/21 1158   fentaNYL (SUBLIMAZE) injection 50-200 mcg  50-200 mcg Intravenous Q30 min PRN Bennie Pierini, MD   100 mcg at 11/21/2021 2312   fentaNYL 2594mg in NS 2573m(1034mml) infusion-PREMIX  50-200 mcg/hr Intravenous Continuous SchBennie PieriniD 20 mL/hr at 11/10/21 1539 200 mcg/hr at 11/10/21 1539   [START ON 11/14/09/9604]lic acid (FOLVITE) tablet 1 mg  1 mg Per Tube Daily KasFlora LippsD       free water 30 mL  30 mL Per Tube Q4H KasFlora LippsD   30 mL at 11/10/21 2013   heparin ADULT infusion 100 units/mL (25000 units/250m40m1,500 Units/hr Intravenous Continuous ChapBenita GutterH 15 mL/hr at 11/10/21 1800 1,500 Units/hr at 11/10/21 1800   iohexol (OMNIPAQUE) 300 MG/ML solution 100 mL  100 mL Intravenous Once PRN UgahCristal Deer       MEDLINE mouth rinse  15 mL Mouth Rinse 10 times per day ScheBennie Pierini   15 mL at 11/10/21 1710   midazolam (VERSED) injection 2 mg  2 mg Intravenous Q15 min PRN ScheBennie Pierini       midazolam (VERSED) injection 2 mg  2 mg Intravenous Q2H PRN ScheBennie Pierini       [START ON 11/11/2021] multivitamin with minerals tablet 1 tablet  1 tablet Per Tube Daily KasaFlora Lipps       norepinephrine (LEVOPHED) 16 mg in 250mL60mmix infusion  0-40 mcg/min Intravenous Titrated Rust-Chester, Britton L, NP 1.88 mL/hr at 11/10/21 1830 2 mcg/min at 11/10/21 1830   pantoprazole (PROTONIX) injection 40 mg  40 mg Intravenous QHS Ouma,Lang Snow  40 mg at 11/07/2021 2127   polyethylene glycol (MIRALAX / GLYCOLAX) packet 17 g  17 g Oral Daily PRN Ouma,Lang Snow      polyethylene glycol (MIRALAX / GLYCOLAX) packet 17 g  17 g Per Tube Daily ScherBennie Pierini  17 g at 11/10/21 1014   propofol (DIPRIVAN) 1000 MG/100ML infusion  5-80 mcg/kg/min Intravenous Continuous MalinNena Polio29.7 mL/hr at 11/10/21 1725 50 mcg/kg/min at 11/10/21 1725   thiamine 500mg 40mormal saline (50ml) 50m  500 mg Intravenous TID Kasa, KFlora LippsStopped at 11/10/21 1744   Followed by   [START Derrill Memo14/2022] thiamine (B-1) injection 100 mg  100 mg Intravenous Q24H Kasa, KFlora Lipps    vecuronium (NORCURON) injection 10 mg  10 mg Intravenous Q1H PRN Rust-Chester, BrittonHuel Cote      Abtx:  Anti-infectives (From admission, onward)    Start     Dose/Rate Route Frequency Ordered Stop   11/10/21 1400   cefTRIAXone (ROCEPHIN) 2 g in sodium chloride 0.9 % 100 mL IVPB        2 g 200 mL/hr over 30 Minutes Intravenous Every 24 hours 11/10/21 1041     11/10/21 1200  cefTRIAXone (ROCEPHIN) 2 g in sodium chloride 0.9 % 100 mL IVPB  Status:  Discontinued  2 g 200 mL/hr over 30 Minutes Intravenous Every 24 hours 11/06/2021 1414 11/18/2021 1556   11/10/21 1200  azithromycin (ZITHROMAX) 500 mg in sodium chloride 0.9 % 250 mL IVPB  Status:  Discontinued        500 mg 250 mL/hr over 60 Minutes Intravenous Every 24 hours 11/22/2021 1414 11/16/2021 1556   11/10/21 0800  vancomycin (VANCOCIN) IVPB 1000 mg/200 mL premix  Status:  Discontinued        1,000 mg 200 mL/hr over 60 Minutes Intravenous Every 12 hours 10/31/2021 1610 11/10/21 1041   10/30/2021 2200  piperacillin-tazobactam (ZOSYN) IVPB 3.375 g  Status:  Discontinued        3.375 g 12.5 mL/hr over 240 Minutes Intravenous Every 8 hours 11/12/2021 1607 11/10/21 1041   11/08/2021 1615  vancomycin (VANCOCIN) IVPB 1000 mg/200 mL premix        1,000 mg 200 mL/hr over 60 Minutes Intravenous  Once 11/05/2021 1610 11/26/2021 1907   11/21/2021 1430  piperacillin-tazobactam (ZOSYN) IVPB 3.375 g  Status:  Discontinued        3.375 g 100 mL/hr over 30 Minutes Intravenous  Once 11/13/2021 1424 11/23/2021 2053   10/31/2021 1415  cefTRIAXone (ROCEPHIN) 2 g in sodium chloride 0.9 % 100 mL IVPB  Status:  Discontinued        2 g 200 mL/hr over 30 Minutes Intravenous Every 24 hours 11/25/2021 1412 11/23/2021 1414   11/05/2021 1415  azithromycin (ZITHROMAX) 500 mg in sodium chloride 0.9 % 250 mL IVPB  Status:  Discontinued        500 mg 250 mL/hr over 60 Minutes Intravenous Every 24 hours 11/06/2021 1412 11/19/2021 1414   11/07/2021 1215  cefTRIAXone (ROCEPHIN) 2 g in sodium chloride 0.9 % 100 mL IVPB  Status:  Discontinued        2 g 200 mL/hr over 30 Minutes Intravenous Every 24 hours 11/23/2021 1209 11/28/2021 1209   11/27/2021 1215  azithromycin (ZITHROMAX) 500 mg in sodium chloride 0.9 % 250 mL IVPB   Status:  Discontinued        500 mg 250 mL/hr over 60 Minutes Intravenous Every 24 hours 11/18/2021 1209 11/25/2021 1209   10/30/2021 1145  vancomycin (VANCOCIN) IVPB 1000 mg/200 mL premix        1,000 mg 200 mL/hr over 60 Minutes Intravenous  Once 11/10/2021 1143 11/13/2021 1601   11/29/2021 1115  cefTRIAXone (ROCEPHIN) 2 g in sodium chloride 0.9 % 100 mL IVPB        2 g 200 mL/hr over 30 Minutes Intravenous  Once 11/28/2021 1111 11/05/2021 1253   11/07/2021 1115  azithromycin (ZITHROMAX) 500 mg in sodium chloride 0.9 % 250 mL IVPB        500 mg 250 mL/hr over 60 Minutes Intravenous  Once 11/18/2021 1111 11/03/2021 1349       REVIEW OF SYSTEMS:  Not available  Objective:  VITALS:  BP 110/62   Pulse (!) 104   Temp 99.5 F (37.5 C)   Resp (!) 30   Ht 5' 5.98" (1.676 m)   Wt 108.5 kg   SpO2 97%   BMI 38.63 kg/m  PHYSICAL EXAM:  General: Intubated sedated Head: Normocephalic, without obvious abnormality, atraumatic. Eyes: Conjunctivae clear, anicteric sclerae. Pupils are equal ENT cannot examine Neck: , symmetrical, no adenopathy, thyroid: non tender no carotid bruit and no JVD.  Lungs: Bilateral crepitations and rhonchi. Heart: Tachycardia Abdomen: Soft, distended. Bowel sounds normal. No masses Extremities: Edema legs Right femoral triple-lumen Right  radial arterial line Foley catheter Skin: No rashes or lesions. Or bruising Lymph: Cervical, supraclavicular normal. Neurologic: Cannot be assessed. Pertinent Labs Lab Results CBC    Component Value Date/Time   WBC 9.5 11/10/2021 0432   RBC 3.92 (L) 11/10/2021 0432   HGB 11.5 (L) 11/10/2021 0432   HCT 35.3 (L) 11/10/2021 0432   PLT 99 (L) 11/10/2021 0432   MCV 90.1 11/10/2021 0432   MCH 29.3 11/10/2021 0432   MCHC 32.6 11/10/2021 0432   RDW 16.6 (H) 11/10/2021 0432   LYMPHSABS 0.7 11/28/2021 1122   MONOABS 0.2 11/03/2021 1122   EOSABS 0.0 11/13/2021 1122   BASOSABS 0.1 11/08/2021 1122    CMP Latest Ref Rng & Units 11/10/2021  11/10/2021 11/10/2021  Glucose 70 - 99 mg/dL - - 148(H)  BUN 6 - 20 mg/dL - - 34(H)  Creatinine 0.61 - 1.24 mg/dL - - 0.89  Sodium 135 - 145 mmol/L 130(L) 129(L) 124(L)  Potassium 3.5 - 5.1 mmol/L 3.5 - 3.4(L)  Chloride 98 - 111 mmol/L - - 95(L)  CO2 22 - 32 mmol/L - - 22  Calcium 8.9 - 10.3 mg/dL - - 7.3(L)  Total Protein 6.5 - 8.1 g/dL - - 7.6  Total Bilirubin 0.3 - 1.2 mg/dL - - 2.1(H)  Alkaline Phos 38 - 126 U/L - - 26(L)  AST 15 - 41 U/L - - 52(H)  ALT 0 - 44 U/L - - 21      Microbiology: Recent Results (from the past 240 hour(s))  Blood culture (routine x 2)     Status: None (Preliminary result)   Collection Time: 11/18/2021 11:22 AM   Specimen: BLOOD  Result Value Ref Range Status   Specimen Description   Final    BLOOD RIGHT ANTECUBITAL Performed at [facility], [address] [place], [place] [address]    Special Requests   Final    BOTTLES DRAWN AEROBIC AND ANAEROBIC Blood Culture adequate volume Performed at [facility], [address] [place], [place] [address]    Culture  Setup Time   Final    GRAM POSITIVE COCCI Organism ID to follow IN BOTH AEROBIC AND ANAEROBIC BOTTLES CRITICAL RESULT CALLED TO, READ BACK BY AND VERIFIED WITH: JASON ROBINS AT [place] 11/10/21 GAA    Culture GRAM POSITIVE COCCI  Final   Report Status PENDING  Incomplete  Resp Panel by RT-PCR (Flu A&B, Covid) Nasopharyngeal Swab     Status: None   Collection Time: 10/31/2021 11:22 AM   Specimen: Nasopharyngeal Swab; Nasopharyngeal(NP) swabs in vial transport medium  Result Value Ref Range Status   SARS Coronavirus 2 by RT PCR NEGATIVE NEGATIVE Final    Comment: (NOTE) SARS-CoV-2 target nucleic acids are NOT DETECTED.  The SARS-CoV-2 RNA is generally detectable in upper respiratory specimens during the acute phase of infection. The lowest concentration of SARS-CoV-2 viral copies this assay can detect is 138 copies/mL. A negative result does not preclude  SARS-Cov-2 infection and should not be used as the sole basis for treatment or other patient management decisions. A negative result may occur with  improper specimen collection/handling, submission of specimen other than nasopharyngeal swab, presence of viral mutation(s) within the areas targeted by this assay, and inadequate number of viral copies(<138 copies/mL). A negative result must be combined with clinical observations, patient history, and epidemiological information. The expected result is Negative.  Fact Sheet for Patients:  EntrepreneurPulse.com.au  Fact Sheet for Healthcare Providers:  IncredibleEmployment.be  This test is no t  yet approved or cleared by the [place] and  has been authorized for detection and/or diagnosis of SARS-CoV-2 by FDA under an Emergency Use Authorization (EUA). This EUA will remain  in effect (meaning this test can be used) for the duration of the COVID-19 declaration under Section 564(b)(1) of the Act, 21 U.S.C.section 360bbb-3(b)(1), unless the authorization is terminated  or revoked sooner.       Influenza A by PCR NEGATIVE NEGATIVE Final   Influenza B by PCR NEGATIVE NEGATIVE Final    Comment: (NOTE) The Xpert Xpress SARS-CoV-2/FLU/RSV plus assay is intended as an aid in the diagnosis of influenza from Nasopharyngeal swab specimens and should not be used as a sole basis for treatment. Nasal washings and aspirates are unacceptable for Xpert Xpress SARS-CoV-2/FLU/RSV testing.  Fact Sheet for Patients: EntrepreneurPulse.com.au  Fact Sheet for Healthcare Providers: IncredibleEmployment.be  This test is not yet approved or cleared by the [place] FDA and has been authorized for detection and/or diagnosis of SARS-CoV-2 by FDA under an Emergency Use Authorization (EUA). This EUA will remain in effect (meaning this test can be used) for the duration of  the COVID-19 declaration under Section 564(b)(1) of the Act, 21 U.S.C. section 360bbb-3(b)(1), unless the authorization is terminated or revoked.  Performed at [facility], [address] [place], [place] [address]   Blood Culture ID Panel (Reflexed)     Status: Abnormal   Collection Time: 11/20/2021 11:22 AM  Result Value Ref Range Status   Enterococcus faecalis NOT DETECTED NOT DETECTED Final   Enterococcus Faecium NOT DETECTED NOT DETECTED Final   Listeria monocytogenes NOT DETECTED NOT DETECTED Final   Staphylococcus species NOT DETECTED NOT DETECTED Final   Staphylococcus aureus (BCID) NOT DETECTED NOT DETECTED Final   Staphylococcus epidermidis NOT DETECTED NOT DETECTED Final   Staphylococcus lugdunensis NOT DETECTED NOT DETECTED Final   Streptococcus species DETECTED (A) NOT DETECTED Final    Comment: CRITICAL RESULT CALLED TO, READ BACK BY AND VERIFIED WITH: JASON ROBINS AT 0051 11/10/21 GAA    Streptococcus agalactiae NOT DETECTED NOT DETECTED Final   Streptococcus pneumoniae DETECTED (A) NOT DETECTED Final    Comment: CRITICAL RESULT CALLED TO, READ BACK BY AND VERIFIED WITH: JASON ROBINS AT 0051 11/10/21 GAA    Streptococcus pyogenes NOT DETECTED NOT DETECTED Final   A.calcoaceticus-baumannii NOT DETECTED NOT DETECTED Final   Bacteroides fragilis NOT DETECTED NOT DETECTED Final   Enterobacterales NOT DETECTED NOT DETECTED Final   Enterobacter cloacae complex NOT DETECTED NOT DETECTED Final   Escherichia coli NOT DETECTED NOT DETECTED Final   Klebsiella aerogenes NOT DETECTED NOT DETECTED Final   Klebsiella oxytoca NOT DETECTED NOT DETECTED Final   Klebsiella pneumoniae NOT DETECTED NOT DETECTED Final   Proteus species NOT DETECTED NOT DETECTED Final   Salmonella species NOT DETECTED NOT DETECTED Final   Serratia marcescens NOT DETECTED NOT DETECTED Final   Haemophilus influenzae NOT DETECTED NOT DETECTED Final   Neisseria meningitidis NOT DETECTED NOT  DETECTED Final   Pseudomonas aeruginosa NOT DETECTED NOT DETECTED Final   Stenotrophomonas maltophilia NOT DETECTED NOT DETECTED Final   Candida albicans NOT DETECTED NOT DETECTED Final   Candida auris NOT DETECTED NOT DETECTED Final   Candida glabrata NOT DETECTED NOT DETECTED Final   Candida krusei NOT DETECTED NOT DETECTED Final   Candida parapsilosis NOT DETECTED NOT DETECTED Final   Candida tropicalis NOT DETECTED NOT DETECTED Final   Cryptococcus neoformans/gattii NOT DETECTED NOT DETECTED Final    Comment:  Performed at [facility], [address] [place], [place] [address]  Blood culture (routine x 2)     Status: None (Preliminary result)   Collection Time: 11/20/2021 11:42 AM   Specimen: BLOOD  Result Value Ref Range Status   Specimen Description   Final    BLOOD BLOOD LEFT FOREARM Performed at [facility], [address], [place], [place] [address]    Special Requests   Final    BOTTLES DRAWN AEROBIC AND ANAEROBIC Blood Culture adequate volume Performed at [facility], [address] [place], [place] [address]    Culture  Setup Time   Final    GRAM POSITIVE COCCI IN BOTH AEROBIC AND ANAEROBIC BOTTLES CRITICAL RESULT CALLED TO, READ BACK BY AND VERIFIED WITH: JASON ROBINS AT 2376 11/10/21 GAA    Culture GRAM POSITIVE COCCI  Final   Report Status PENDING  Incomplete  Urine Culture     Status: None   Collection Time: 11/10/2021 12:08 PM   Specimen: Urine, Random  Result Value Ref Range Status   Specimen Description   Final    URINE, RANDOM Performed at [facility], [address] [place], [place] [address]    Special Requests   Final    NONE Performed at [facility], [address] [place], [place] [address]    Culture   Final    NO GROWTH Performed at [facility], [place] [address], [place], [place] [address]    Report Status 11/10/2021 FINAL  Final  MRSA Next Gen by PCR, Nasal     Status: None    Collection Time: 11/10/2021  4:13 PM   Specimen: Nasal Mucosa; Nasal Swab  Result Value Ref Range Status   MRSA by PCR Next Gen NOT DETECTED NOT DETECTED Final    Comment: (NOTE) The GeneXpert MRSA Assay (FDA approved for NASAL specimens only), is one component of a comprehensive MRSA colonization surveillance program. It is not intended to diagnose MRSA infection nor to guide or monitor treatment for MRSA infections. Test performance is not FDA approved in patients less than 20 years old. Performed at [facility], [address] [place], [place] [address]   Culture, blood (Routine X 2) w Reflex to ID Panel     Status: None (Preliminary result)   Collection Time: 11/18/2021  6:39 PM   Specimen: BLOOD  Result Value Ref Range Status   Specimen Description BLOOD BLOOD LEFT HAND  Final   Special Requests   Final    BOTTLES DRAWN AEROBIC AND ANAEROBIC Blood Culture adequate volume   Culture   Final    NO GROWTH < 12 HOURS Performed at [facility], [address] [place], [place] [address]    Report Status PENDING  Incomplete  Culture, blood (Routine X 2) w Reflex to ID Panel     Status: None (Preliminary result)   Collection Time: 11/23/2021  6:40 PM   Specimen: BLOOD  Result Value Ref Range Status   Specimen Description BLOOD BLOOD RIGHT HAND  Final   Special Requests   Final    BOTTLES DRAWN AEROBIC AND ANAEROBIC Blood Culture adequate volume   Culture   Final    NO GROWTH < 12 HOURS Performed at [facility], [address] [place], [place] [address]    Report Status PENDING  Incomplete  Culture, Respiratory w Gram Stain     Status: None (Preliminary result)   Collection Time: 11/10/21 11:55 AM  Specimen: Bronchoalveolar Lavage; Respiratory  Result Value Ref Range Status   Specimen Description   Final    BRONCHIAL ALVEOLAR LAVAGE Performed at [facility], [address] [place], [place] [address]    Special Requests   Final     NONE Performed at [facility], [place], [place] [address]    Gram Stain   Final    ABUNDANT WBC PRESENT,BOTH PMN AND MONONUCLEAR MODERATE GRAM POSITIVE COCCI RARE GRAM VARIABLE ROD Performed at [facility], [place] [address], [place], [place] [address]    Culture PENDING  Incomplete   Report Status PENDING  Incomplete    IMAGING RESULTS:  I have personally reviewed the films ?Rounded density is noted in right lower lobe concerning for mass or nodule. Nodular opacities are seen in the left midlung. Some degree of left pleural effusion is noted as well  CT abdomen Enlarged liver with nodular contour concerning for cirrhosis. CT chest  Bilateral pulmonary pulmonary masses and confluent nodularity.Dense near complete consolidation of the LEFT lower lobe with associated LEFT effusion. Findings are concerning for pulmonary malignancy. Consider LYMPHOMA. Granulomatous disease or infectious process is much less favored.  Impression/Recommendation 35 year old male presenting with shortness of breath, cough with brown sputum, chest pain, fever.  Bilateral pulmonary infiltrates with acute hypoxic respiratory failure.  Patient is intubated. Strep pneumo bacteremia Very likely the lung lesions are secondary to pneumococcus But because of nodular lesions and masslike lesions he underwent bronchoscopy and BAL has been sent for various tests. TB is less likely.  But since that test has been sent will await AFB stain and if negative can remove airborne isolation. Patient was on Vanco and Zosyn and just been switched to ceftriaxone. Patient will need repeat blood cultures and 2D echo.  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  The cirrhosis was evidenced on the CAT scan with ascites.  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  Hyponatremia combination of pulmonary lesions and alcohol consumption with liver  cirrhosis  Hypokalemia  Anemia  Thrombocytopenia combination of alcohol and infection.   HIV nonreactive.  ___Patient is acutely ill.  ________________________________________________ Discussed with his wife in detail. Note:  This document was prepared using Dragon voice recognition software and may include unintentional dictation errors.

## 2021-11-10 NOTE — Progress Notes (Addendum)
NAME:  Adam White, MRN:  [id number], DOB:  08-Jan-1986, LOS: 1 ADMISSION DATE:  11/17/2021  BRIEF SYNOPSIS 35 y.o with significant PMH of EtOH abuse and  thrombocytopenia who presented to the ED with chief complaints of progressive shortness of breath. +ETOH ABUSE, +liver cirrhosis   CT chest with severe b/l opacities c/w pneumonia Progressive resp failure and intubated    Significant Hospital Events: Including procedures, antibiotic start and stop dates in addition to other pertinent events   12/11 severe resp failure 12/12 severe resp failure, remains on  vent     Significant Diagnostic Tests:  12/11: Chest Xray>Rounded density is noted in right lower lobe concerning for mass or nodule. Nodular opacities are seen in the left midlung. Some degree of left pleural effusion is noted as well. CT scan of the chest is recommended for further evaluation. 12/11: CTA chest>Bilateral pulmonary pulmonary masses and confluent nodularity. Dense near complete consolidation of the LEFT lower lobe with associated LEFT effusion. Findings are concerning for pulmonary malignancy. Consider LYMPHOMA. Granulomatous disease or infectious process is much less favored. 12/11: CTA abdomen and pelvis>   Micro Data:  12/11: SARS-CoV-2 PCR> negative 12/11: Influenza PCR> negative 12/11: Blood culture x2> 12/11: Urine Culture> 12/11: MRSA PCR>>  12/11: Strep pneumo urinary antigen> 12/11: Legionella urinary antigen> 12/11: Mycoplasma pneumonia>   Antimicrobials:  Vancomycin 12/11> Azithromycin 12/11> Ceftriaxone 12/11>   Antimicrobials:   Antibiotics Given (last 72 hours)     Date/Time Action Medication Dose Rate   11/15/2021 1154 New Bag/Given   cefTRIAXone (ROCEPHIN) 2 g in sodium chloride 0.9 % 100 mL IVPB 2 g 200 mL/hr   11/16/2021 1159 New Bag/Given   azithromycin (ZITHROMAX) 500 mg in sodium chloride 0.9 % 250 mL IVPB 500 mg 250 mL/hr   11/13/2021 1456 New Bag/Given   vancomycin  (VANCOCIN) IVPB 1000 mg/200 mL premix 1,000 mg 200 mL/hr   11/25/2021 1807 New Bag/Given   vancomycin (VANCOCIN) IVPB 1000 mg/200 mL premix 1,000 mg 200 mL/hr   11/15/2021 2124 New Bag/Given   piperacillin-tazobactam (ZOSYN) IVPB 3.375 g 3.375 g 12.5 mL/hr   11/10/21 0539 New Bag/Given   piperacillin-tazobactam (ZOSYN) IVPB 3.375 g 3.375 g 12.5 mL/hr            Interim History / Subjective:  Remains on vent Severe resp failure CT chest reviewed  Vent Mode: PRVC FiO2 (%):  [60 %-100 %] 70 % Set Rate:  [18 bmp-30 bmp] 30 bmp Vt Set:  [360 mL-480 mL] 400 mL PEEP:  [10 cmH20-16 cmH20] 10 cmH20 Plateau Pressure:  [25 cmH20-30 cmH20] 25 cmH20       Objective   Blood pressure 107/61, pulse (!) 106, temperature 99.7 F (37.6 C), resp. rate (!) 30, height 5' 5.98" (1.676 m), weight 108.5 kg, SpO2 99 %.    Vent Mode: PRVC FiO2 (%):  [60 %-100 %] 70 % Set Rate:  [18 bmp-30 bmp] 30 bmp Vt Set:  [360 mL-480 mL] 400 mL PEEP:  [10 cmH20-16 cmH20] 10 cmH20 Plateau Pressure:  [25 cmH20-30 cmH20] 25 cmH20   Intake/Output Summary (Last 24 hours) at 11/10/2021 0844 Last data filed at 11/10/2021 0801 Gross per 24 hour  Intake 3218.77 ml  Output 3500 ml  Net -281.23 ml   Filed Weights   11/08/2021 1107 11/20/2021 1726 11/10/21 0437  Weight: 98.9 kg 109 kg 108.5 kg      REVIEW OF SYSTEMS  PATIENT IS UNABLE TO PROVIDE COMPLETE REVIEW OF SYSTEMS DUE TO SEVERE CRITICAL  ILLNESS AND TOXIC METABOLIC ENCEPHALOPATHY   PHYSICAL EXAMINATION:  GENERAL:critically ill appearing, +resp distress EYES: Pupils equal, round, reactive to light.  No scleral icterus.  MOUTH: Moist mucosal membrane. INTUBATED NECK: Supple.  PULMONARY: +rhonchi, +wheezing CARDIOVASCULAR: S1 and S2.  No murmurs  GASTROINTESTINAL: Soft, nontender, -distended. Positive bowel sounds.  MUSCULOSKELETAL: No swelling, clubbing, or edema.  NEUROLOGIC: obtunded SKIN:intact,warm,dry     Labs/imaging that I  havepersonally reviewed  (right click and "Reselect all SmartList Selections" daily)     ASSESSMENT AND PLAN SYNOPSIS  35 yo Hispanic male with severe hypoxic respiratory failure with severe pneumonia with ETOH abuse and liver cirrhosis  Severe ACUTE Hypoxic and Hypercapnic Respiratory Failure -continue Mechanical Ventilator support -continue Bronchodilator Therapy -Wean Fio2 and PEEP as tolerated -VAP/VENT bundle implementation -will NOT perform SAT/SBT when respiratory parameters are met  Vent Mode: PRVC FiO2 (%):  [60 %-100 %] 70 % Set Rate:  [18 bmp-30 bmp] 30 bmp Vt Set:  [360 mL-480 mL] 400 mL PEEP:  [10 cmH20-16 cmH20] 10 cmH20 Plateau Pressure:  [25 cmH20-30 cmH20] 25 cmH20    SEVERE ALCOHOL ABUSE  -Therapy with Thiamine and MVI -CIWA Protocol High risk for death   CARDIAC ICU monitoring   NEUROLOGY Acute toxic metabolic encephalopathy, need for sedation Goal RASS -2 to -3   INFECTIOUS DISEASE -continue antibiotics as prescribed -follow up cultures R/o TB but doubt active TB  ENDO - ICU hypoglycemic\Hyperglycemia protocol -check FSBS per protocol   GI-LIVER CIRRHOSIS GI PROPHYLAXIS as indicated  NUTRITIONAL STATUS DIET-->TF's as tolerated Constipation protocol as indicated   ELECTROLYTES -follow labs as needed -replace as needed -pharmacy consultation and following   ACUTE ANEMIA- TRANSFUSE AS NEEDED CONSIDER TRANSFUSION  IF HGB<7 DVT PRX with TED/SCD's ONLY     Best practice (right click and "Reselect all SmartList Selections" daily)  Diet: NPO Pain/Anxiety/Delirium protocol (if indicated): Yes (RASS goal -2) VAP protocol (if indicated): Yes DVT prophylaxis: LMWH GI prophylaxis: PPI Glucose control:  SSI No Central venous access:  Yes, and it is still needed Arterial line:  N/A Foley:  Yes, and it is still needed Mobility:  bed rest  Code Status:  FULL Disposition:ICU  Labs   CBC: Recent Labs  Lab 11/25/2021 1122  11/10/21 0432  WBC 5.2 9.5  NEUTROABS 4.1  --   HGB 12.9* 11.5*  HCT 38.3* 35.3*  MCV 85.7 90.1  PLT 96* 99*    Basic Metabolic Panel: Recent Labs  Lab 11/05/2021 1122 11/04/2021 1340 11/12/2021 1600 11/03/2021 1840 11/10/21 0032 11/10/21 0432  NA 117* 118* 121* 123* 121* 124*  K 2.8* 2.6*  --   --  3.5 3.4*  CL 87* 91*  --   --   --  95*  CO2 21* 19*  --   --   --  22  GLUCOSE 144* 107*  --   --   --  148*  BUN 41* 38*  --   --   --  34*  CREATININE 1.02 0.95  --   --   --  0.89  CALCIUM 7.8* 6.8*  --   --   --  7.3*  MG  --   --  1.7  --   --  2.6*  PHOS  --   --  1.2*  --   --  3.5   GFR: Estimated Creatinine Clearance: 136.4 mL/min (by C-G formula based on SCr of 0.89 mg/dL). Recent Labs  Lab 11/17/2021 1121 11/21/2021 1122 11/02/2021 1340 11/19/2021 1410  11/10/21 0432  PROCALCITON  --   --  3.83  --   --   WBC  --  5.2  --   --  9.5  LATICACIDVEN 2.7*  --   --  1.7  --     Liver Function Tests: Recent Labs  Lab 11/13/2021 1122 11/10/21 0432  AST 58* 52*  ALT 25 21  ALKPHOS 22* 26*  BILITOT 3.8* 2.1*  PROT 8.7* 7.6  ALBUMIN 3.0* 2.3*   Recent Labs  Lab 11/24/2021 1533  LIPASE 30   No results for input(s): AMMONIA in the last 168 hours.  ABG    Component Value Date/Time   PHART 7.29 (L) 11/10/2021 0500   PCO2ART 47 11/10/2021 0500   PO2ART 104 11/10/2021 0500   HCO3 22.6 11/10/2021 0500   ACIDBASEDEF 4.1 (H) 11/10/2021 0500   O2SAT 97.3 11/10/2021 0500     Coagulation Profile: Recent Labs  Lab 11/04/2021 1122  INR 1.4*    Cardiac Enzymes: No results for input(s): CKTOTAL, CKMB, CKMBINDEX, TROPONINI in the last 168 hours.  HbA1C: No results found for: HGBA1C  CBG: Recent Labs  Lab 11/21/2021 1716  GLUCAP 130*     DVT/GI PRX  assessed I Assessed the need for Labs I Assessed the need for Foley I Assessed the need for Central Venous Line Family Discussion when available I Assessed the need for Mobilization I made an Assessment of medications  to be adjusted accordingly Safety Risk assessment completed  CASE DISCUSSED IN MULTIDISCIPLINARY ROUNDS WITH ICU TEAM     Critical Care Time devoted to patient care services described in this note is 55 minutes.   Critical care was necessary to treat /prevent imminent and life-threatening deterioration.   PATIENT WITH VERY POOR PROGNOSIS I ANTICIPATE PROLONGED ICU LOS  Patient is critically ill. Patient with Multiorgan failure and at high risk for cardiac arrest and death.    Corrin Parker, M.D.  [facility]  Medical Director [place] [facility] Cardio-Pulmonary Department

## 2021-11-10 NOTE — Progress Notes (Signed)
GOALS OF CARE DISCUSSION  The Clinical status was relayed to family in detail. Wife and Brothers and mother and father Updated and notified of patients medical condition.    Patient remains unresponsive and will not open eyes to command.   Patient is having a weak cough and struggling to remove secretions.   Patient with increased WOB and using accessory muscles to breathe Explained to family course of therapy and the modalities  Severe ETOH abuse with liver cirrhosis and PVT High risk for bleeding Severe sepsis and pneumonia   Patient with Progressive multiorgan failure with a very high probablity of a very minimal chance of meaningful recovery despite all aggressive and optimal medical therapy.  PATIENT REMAINS FULL CODE  Family understands the situation.    The Risks and Benefits of Heparin Therapy relayed to family   I have discussed the risk for Acute Bleeding, abd bleeding, head bleeding. They have all agreed and consented to therapy  The family understand the risks and benefits and have agreed to proceed with heparin therapy   Family are satisfied with Plan of action and management. All questions answered  Additional CC time 45 mins   Myla Mauriello Santiago Glad, M.D.  [facility]  Medical Director [facility] Medical Director [facility] Cardio-Pulmonary Department

## 2021-11-10 NOTE — Procedures (Signed)
Central Venous Catheter Insertion Procedure Note  Adam White  [id number]  1986-09-04  Date:11/10/21  Time:12:17 AM   Provider Performing:Venus Gilles L Rust-Chester   Procedure: Insertion of Non-tunneled Central Venous [phone]) with US guidance ([id number])   Indication(s) Medication administration  Consent Risks of the procedure as well as the alternatives and risks of each were explained to the patient and/or caregiver.  Consent for the procedure was obtained and is signed in the bedside chart  Anesthesia Topical only with 1% lidocaine , propofol & fentanyl drips infusing  Timeout Verified patient identification, verified procedure, site/side was marked, verified correct patient position, special equipment/implants available, medications/allergies/relevant history reviewed, required imaging and test results available.  Sterile Technique Maximal sterile technique including full sterile barrier drape, hand hygiene, sterile gown, sterile gloves, mask, hair covering, sterile ultrasound probe cover (if used).  Procedure Description Area of catheter insertion was cleaned with chlorhexidine and draped in sterile fashion.  With real-time ultrasound guidance a central venous catheter was placed into the right femoral vein. Nonpulsatile blood flow and easy flushing noted in all ports.  The catheter was sutured in place and sterile dressing applied.  Complications/Tolerance None; patient tolerated the procedure well. Chest X-ray is ordered to verify placement for internal jugular or subclavian cannulation.   Chest x-ray is not ordered for femoral cannulation.  EBL Minimal  Specimen(s) None   Initially attempted L IJ CVC placement, which became malpositioned in the subclavian vein. Due to challenging neck anatomy and malpositioning on initial attempt, RIGHT femoral CVC placed without complication.  Cheryll Cockayne Rust-Chester, AGACNP-BC Acute Care Nurse Practitioner [place]  [facility]   [phone] / [phone] Please see Amion for pager details.

## 2021-11-11 DIAGNOSIS — J80 Acute respiratory distress syndrome: Secondary | ICD-10-CM

## 2021-11-11 DIAGNOSIS — F10931 Alcohol use, unspecified with withdrawal delirium: Secondary | ICD-10-CM

## 2021-11-11 DIAGNOSIS — F1010 Alcohol abuse, uncomplicated: Secondary | ICD-10-CM

## 2021-11-11 DIAGNOSIS — R410 Disorientation, unspecified: Secondary | ICD-10-CM

## 2021-11-11 LAB — BLOOD GAS, ARTERIAL
Acid-base deficit: 0.5 mmol/L (ref 0.0–2.0)
Acid-base deficit: 1 mmol/L (ref 0.0–2.0)
Bicarbonate: 25.8 mmol/L (ref 20.0–28.0)
Bicarbonate: 25.3 mmol/L (ref 20.0–28.0)
FIO2: 100
FIO2: 1
MECHVT: 400 mL
MECHVT: 400 mL
O2 Saturation: 92.5 %
O2 Saturation: 92.5 %
PEEP: 10 cmH2O
PEEP: 5 cmH2O
Patient temperature: 37
Patient temperature: 37
RATE: 30 resp/min
RATE: 30 resp/min
pCO2 arterial: 49 mmHg — ABNORMAL HIGH (ref 32.0–48.0)
pCO2 arterial: 48 mmHg (ref 32.0–48.0)
pH, Arterial: 7.33 — ABNORMAL LOW (ref 7.350–7.450)
pH, Arterial: 7.33 — ABNORMAL LOW (ref 7.350–7.450)
pO2, Arterial: 70 mmHg — ABNORMAL LOW (ref 83.0–108.0)
pO2, Arterial: 70 mmHg — ABNORMAL LOW (ref 83.0–108.0)

## 2021-11-11 LAB — BASIC METABOLIC PANEL
Anion gap: 4 — ABNORMAL LOW (ref 5–15)
BUN: 27 mg/dL — ABNORMAL HIGH (ref 6–20)
CO2: 26 mmol/L (ref 22–32)
Calcium: 7.9 mg/dL — ABNORMAL LOW (ref 8.9–10.3)
Chloride: 104 mmol/L (ref 98–111)
Creatinine, Ser: 0.7 mg/dL (ref 0.61–1.24)
GFR, Estimated: >60 mL/min (ref >60)
Glucose, Bld: 113 mg/dL — ABNORMAL HIGH (ref 70–99)
Potassium: 3.7 mmol/L (ref 3.5–5.1)
Sodium: 134 mmol/L — ABNORMAL LOW (ref 135–145)

## 2021-11-11 LAB — CBC
HCT: 32.2 % — ABNORMAL LOW (ref 39.0–52.0)
Hemoglobin: 10.1 g/dL — ABNORMAL LOW (ref 13.0–17.0)
MCH: 28.9 pg (ref 26.0–34.0)
MCHC: 31.4 g/dL (ref 30.0–36.0)
MCV: 92.3 fL (ref 80.0–100.0)
Platelets: 74 10*3/uL — ABNORMAL LOW (ref 150–400)
RBC: 3.49 MIL/uL — ABNORMAL LOW (ref 4.22–5.81)
RDW: 17 % — ABNORMAL HIGH (ref 11.5–15.5)
WBC: 7.9 10*3/uL (ref 4.0–10.5)
nRBC: 0.5 % — ABNORMAL HIGH (ref 0.0–0.2)

## 2021-11-11 LAB — HEPATIC FUNCTION PANEL
ALT: 23 U/L (ref 0–44)
AST: 65 U/L — ABNORMAL HIGH (ref 15–41)
Albumin: 2.6 g/dL — ABNORMAL LOW (ref 3.5–5.0)
Alkaline Phosphatase: 42 U/L (ref 38–126)
Bilirubin, Direct: 0.9 mg/dL — ABNORMAL HIGH (ref 0.0–0.2)
Indirect Bilirubin: 0.6 mg/dL (ref 0.3–0.9)
Total Bilirubin: 1.5 mg/dL — ABNORMAL HIGH (ref 0.3–1.2)
Total Protein: 7 g/dL (ref 6.5–8.1)

## 2021-11-11 LAB — PHOSPHORUS: Phosphorus: 1.9 mg/dL — ABNORMAL LOW (ref 2.5–4.6)

## 2021-11-11 LAB — LEGIONELLA PNEUMOPHILA SEROGP 1 UR AG: L. pneumophila Serogp 1 Ur Ag: NEGATIVE

## 2021-11-11 LAB — SODIUM
Sodium: 132 mmol/L — ABNORMAL LOW (ref 135–145)
Sodium: 133 mmol/L — ABNORMAL LOW (ref 135–145)
Sodium: 134 mmol/L — ABNORMAL LOW (ref 135–145)
Sodium: 135 mmol/L (ref 135–145)
Sodium: 135 mmol/L (ref 135–145)

## 2021-11-11 LAB — MAGNESIUM: Magnesium: 3.1 mg/dL — ABNORMAL HIGH (ref 1.7–2.4)

## 2021-11-11 MED ORDER — SODIUM CHLORIDE 0.9 % IV SOLN
3.0000 mg/h | INTRAVENOUS | Status: DC
  Administered 2021-11-11 – 2021-11-13 (×4): 3 mg/h via INTRAVENOUS
  Filled 2021-11-11 (×4): qty 5

## 2021-11-11 MED ORDER — HYDROMORPHONE HCL 1 MG/ML IJ SOLN
2.0000 mg | INTRAMUSCULAR | Status: DC | PRN
  Administered 2021-11-11 – 2021-11-12 (×5): 2 mg via INTRAVENOUS
  Filled 2021-11-11 (×4): qty 2

## 2021-11-11 MED ORDER — POTASSIUM PHOSPHATES 15 MMOLE/5ML IV SOLN
30.0000 mmol | Freq: Once | INTRAVENOUS | Status: AC
  Administered 2021-11-11: 30 mmol via INTRAVENOUS
  Filled 2021-11-11: qty 10

## 2021-11-11 MED ORDER — PIPERACILLIN-TAZOBACTAM 3.375 G IVPB
3.3750 g | Freq: Three times a day (TID) | INTRAVENOUS | Status: DC
  Administered 2021-11-11 – 2021-11-12 (×3): 3.375 g via INTRAVENOUS
  Filled 2021-11-11 (×3): qty 50

## 2021-11-11 MED ORDER — DIAZEPAM 5 MG/ML IJ SOLN
5.0000 mg | Freq: Four times a day (QID) | INTRAMUSCULAR | Status: DC
  Administered 2021-11-11 – 2021-11-13 (×10): 5 mg via INTRAVENOUS
  Filled 2021-11-11 (×10): qty 2

## 2021-11-11 MED ORDER — ACETAMINOPHEN 325 MG PO TABS
650.0000 mg | ORAL_TABLET | Freq: Once | ORAL | Status: AC
  Administered 2021-11-11: 650 mg
  Filled 2021-11-11: qty 2

## 2021-11-11 MED ORDER — HYDROMORPHONE HCL 1 MG/ML IJ SOLN
INTRAMUSCULAR | Status: AC
  Administered 2021-11-11: 2 mg
  Filled 2021-11-11: qty 2

## 2021-11-11 NOTE — Consult Note (Signed)
Pharmacy Antibiotic Note  Adam White is a 35 y.o. male with PMH of EtOH abuse and  thrombocytopenia who was admitted on 03-Dec-2021 with pneumonia.  Pharmacy has been consulted for Zosyn dosing.  Pt was de escalated from Vanc and Zosyn to ceftriaxone on 12/12 given BCID results. Pt spiked fever to Tmax 101.7 on 12/13 and was initiated back on Zosyn   WBC WNL, BCID strep pneumo, Bcx strep pneumo (susceptibilities pending), MRSA PCR not detected, fungus culture and acid fast smear sent   Plan: Zosyn --Will initiate Zosyn 3.375g IV q8h  Will continue to monitor renal function and Bcx susceptibilities and adjust regimen as indicated    Height: 5' 5.98" (167.6 cm) Weight: 103.6 kg (228 lb 6.3 oz) IBW/kg (Calculated) : 63.76  Temp (24hrs), Avg:99.9 F (37.7 C), Min:96.4 F (35.8 C), Max:101.7 F (38.7 C)  Recent Labs  Lab 12-03-2021 1121 12/03/21 1122 2021/12/03 1340 12-03-2021 1410 11/10/21 0432 11/11/21 0405  WBC  --  5.2  --   --  9.5 7.9  CREATININE  --  1.02 0.95  --  0.89 0.70  LATICACIDVEN 2.7*  --   --  1.7  --   --     Estimated Creatinine Clearance: 148.1 mL/min (by C-G formula based on SCr of 0.7 mg/dL).    No Known Allergies  Antimicrobials this admission: Ongoing Zosyn 12/13  Completed 12/11 Azithromycin x1 in ED Vancomycin 12/11 >> 12/12 Zosyn 12/11 >> 12/12 Ceftriaxone 12/11 >> 12/12  Microbiology results: 12/11 BCx: trep pneumo (susceptibilities pending) 12/11 UCx: NG  12/11 MRSA PCR: not detected  12/11 BCID: strep pneumo  12/11 Fungus culture: sent 12/11 Aid fast smear: sent   Thank you for allowing pharmacy to be a part of this patients care.  Doloris Hall, PharmD Pharmacy Resident  11/11/2021 2:31 PM

## 2021-11-11 NOTE — Progress Notes (Signed)
Nutrition Brief Note   Propofol change   INTERVENTION:   Continue Vital HP @20ml /hr + ProSource TF 29ml QID via tube   Propofol: 29.7 ml/hr- provides 784kcal/day   Free water flushes 69ml q4 hours to maintain tube patency   Regimen provides 1584kcal/day, 130g/day protein and 5106ml/day free water   MVI and folic acid daily via tube   Pt at high refeed risk; recommend monitor potassium, magnesium and phosphorus labs daily until stable  Estimated Nutritional Needs:   Kcal:  1200-1519kcal/day  Protein:  >129g/day  Fluid:  1.9-2.2L/day  93m MS, RD, LDN Please refer to [facility] for RD and/or RD on-call/weekend/after hours pager

## 2021-11-11 NOTE — Consult Note (Signed)
ANTICOAGULATION CONSULT NOTE  Pharmacy Consult for IV Heparin Indication:  Portal vein thrombosis  Patient Measurements: Height: 5' 5.98" (167.6 cm) Weight: 103.6 kg (228 lb 6.3 oz) IBW/kg (Calculated) : 63.76 Heparin Dosing Weight: 88.5 kg  Labs: Recent Labs    11-19-21 1122 19-Nov-2021 1230 November 19, 2021 1340 11/10/21 0432 11/11/21 0405  HGB 12.9*  --   --  11.5* 10.1*  HCT 38.3*  --   --  35.3* 32.2*  PLT 96*  --   --  99* 74*  APTT  --  39*  --   --   --   LABPROT 17.3*  --   --   --   --   INR 1.4*  --   --   --   --   CREATININE 1.02  --  0.95 0.89 0.70  TROPONINIHS 10  --  10  --   --      Estimated Creatinine Clearance: 148.1 mL/min (by C-G formula based on SCr of 0.7 mg/dL).   Medical History: History reviewed. No pertinent past medical history.  Medications:  No anticoagulation prior to admission per my chart review  Assessment: Patient is a 34 y/o M with medical history including EtOH use disorder and thrombocytopenia who is admitted with acute respiratory failure secondary to CAP / pleural effusion. RUQ ultrasound performed 12/12 showed portal vein thrombosis. Pharmacy consulted to initiate heparin infusion for portal vein thrombosis.  CBC notable for mild anemia and mild stable thrombocytopenia. Baseline INR 1.4, aPTT 39 (both slightly elevated likely secondary to liver disease)  Goal of Therapy:  Heparin level 0.3-0.7 units/ml Monitor platelets by anticoagulation protocol: Yes   Plan:  D/t 12/12 PM desaturation, complicated initial intubation, and pt nosebleed heparin gtt was stopped d/t c/f potential of airway bleed.   Pharmacy will sign-off consult at this time, but CTM based on GOC and pt stability. Please reconsult as appropriate for heparin mgmt.  Sherlynn Carbon Xolani Degracia 11/11/2021,8:22 AM

## 2021-11-11 NOTE — Consult Note (Signed)
PHARMACY CONSULT NOTE  Pharmacy Consult for Electrolyte Monitoring and Replacement   Recent Labs: Potassium (mmol/L)  Date Value  11/11/2021 3.7   Magnesium (mg/dL)  Date Value  24/23/5361 3.1 (H)   Calcium (mg/dL)  Date Value  44/31/5400 7.9 (L)   Albumin (g/dL)  Date Value  86/76/1950 2.6 (L)   Phosphorus (mg/dL)  Date Value  93/26/7124 1.9 (L)   Sodium (mmol/L)  Date Value  11/11/2021 134 (L)    Assessment: Patient is a 35 y/o M with history of alcohol use disorder who presented to the ED 12/11 with pleuritic chest pain. CTA interpretation with bilateral pulmonary masses and associated left effusion concerning for malignancy with infectious process or granulomatous disease felt to be less likely. Appears patient was intubated in the ED. Pharmacy consulted to assist with electrolyte monitoring and replacement as indicated.  Labs  Na 132 (0005)>134 (0405)>133 (0944)> 134 (1234) K 3.4>3.5>3.7 Phos 3.5>1.9 Mg 2.6>3.1  MIVF: NS @ 66mL/hr since 12/11 >> stopped on 12/12 d/t correction of 30mEq/24h  Goal of Therapy:  Electrolytes within normal limits  Plan:  --Na trending appropriately, NS stopped 12/12 --Phos noted to be 1.9, replaced with K phos IV x1 --Will continue to monitor electrolytes and f/u with AM labs  Doloris Hall, PharmD Pharmacy Resident  11/11/2021 2:20 PM

## 2021-11-11 NOTE — Progress Notes (Signed)
NAME:  Adam White, MRN:  [id number], DOB:  05-29-1986, LOS: 2 ADMISSION DATE:  11/23/2021  BRIEF SYNOPSIS 35 y.o with significant PMH of EtOH abuse and  thrombocytopenia who presented to the ED with chief complaints of progressive shortness of breath. +ETOH ABUSE, +liver cirrhosis   CT chest with severe b/l opacities c/w pneumonia Progressive resp failure and intubated    Significant Hospital Events: Including procedures, antibiotic start and stop dates in addition to other pertinent events   12/11 severe resp failure 12/12 severe resp failure, remains on  vent     Significant Diagnostic Tests:  12/11: Chest Xray>Rounded density is noted in right lower lobe concerning for mass or nodule. Nodular opacities are seen in the left midlung. Some degree of left pleural effusion is noted as well. CT scan of the chest is recommended for further evaluation. 12/11: CTA chest>Bilateral pulmonary pulmonary masses and confluent nodularity. Dense near complete consolidation of the LEFT lower lobe with associated LEFT effusion. Findings are concerning for pulmonary malignancy. Consider LYMPHOMA. Granulomatous disease or infectious process is much less favored. 12/11: CTA abdomen and pelvis>   Micro Data:  12/11: SARS-CoV-2 PCR> negative 12/11: Influenza PCR> negative 12/11: Blood culture x2> 12/11: Urine Culture> 12/11: MRSA PCR>>  12/11: Strep pneumo urinary antigen> 12/11: Legionella urinary antigen> 12/11: Mycoplasma pneumonia>   Antimicrobials:  Vancomycin 12/11> Azithromycin 12/11> Ceftriaxone 12/11>   Antimicrobials:   Antibiotics Given (last 72 hours)     Date/Time Action Medication Dose Rate   11/21/2021 1154 New Bag/Given   cefTRIAXone (ROCEPHIN) 2 g in sodium chloride 0.9 % 100 mL IVPB 2 g 200 mL/hr   11/20/2021 1159 New Bag/Given   azithromycin (ZITHROMAX) 500 mg in sodium chloride 0.9 % 250 mL IVPB 500 mg 250 mL/hr   11/13/2021 1456 New Bag/Given   vancomycin  (VANCOCIN) IVPB 1000 mg/200 mL premix 1,000 mg 200 mL/hr   11/02/2021 1807 New Bag/Given   vancomycin (VANCOCIN) IVPB 1000 mg/200 mL premix 1,000 mg 200 mL/hr   11/23/2021 2124 New Bag/Given   piperacillin-tazobactam (ZOSYN) IVPB 3.375 g 3.375 g 12.5 mL/hr   11/10/21 0539 New Bag/Given   piperacillin-tazobactam (ZOSYN) IVPB 3.375 g 3.375 g 12.5 mL/hr   11/10/21 0913 New Bag/Given   vancomycin (VANCOCIN) IVPB 1000 mg/200 mL premix 1,000 mg 200 mL/hr   11/10/21 1436 New Bag/Given   cefTRIAXone (ROCEPHIN) 2 g in sodium chloride 0.9 % 100 mL IVPB 2 g 200 mL/hr   11/11/21 1101 New Bag/Given   piperacillin-tazobactam (ZOSYN) IVPB 3.375 g 3.375 g 12.5 mL/hr            Interim History / Subjective:  Remains on vent Severe resp failure CT chest reviewed  Vent Mode: Bi-Vent FiO2 (%):  [80 %-100 %] 100 % Set Rate:  [30 bmp] 30 bmp Vt Set:  [400 mL] 400 mL PEEP:  [8 cmH20-14 cmH20] 8 cmH20       Objective   Blood pressure 117/61, pulse (!) 126, temperature (!) 102 F (38.9 C), resp. rate (!) 38, height 5' 5.98" (1.676 m), weight 103.6 kg, SpO2 92 %.    Vent Mode: Bi-Vent FiO2 (%):  [80 %-100 %] 100 % Set Rate:  [30 bmp] 30 bmp Vt Set:  [400 mL] 400 mL PEEP:  [8 cmH20-14 cmH20] 8 cmH20   Intake/Output Summary (Last 24 hours) at 11/11/2021 1611 Last data filed at 11/11/2021 1600 Gross per 24 hour  Intake 2673.81 ml  Output 2290 ml  Net 383.81 ml  Filed Weights   2021/11/27 1726 11/10/21 0437 11/11/21 0457  Weight: 109 kg 108.5 kg 103.6 kg      REVIEW OF SYSTEMS  PATIENT IS UNABLE TO PROVIDE COMPLETE REVIEW OF SYSTEMS DUE TO SEVERE CRITICAL ILLNESS AND TOXIC METABOLIC ENCEPHALOPATHY   PHYSICAL EXAMINATION:  GENERAL:critically ill appearing, +resp distress EYES: Pupils equal, round, reactive to light.  No scleral icterus.  MOUTH: Moist mucosal membrane. INTUBATED NECK: Supple.  PULMONARY: +rhonchi, +wheezing CARDIOVASCULAR: S1 and S2.  No murmurs   GASTROINTESTINAL: Soft, nontender, -distended. Positive bowel sounds.  MUSCULOSKELETAL: No swelling, clubbing, or edema.  NEUROLOGIC: obtunded SKIN:intact,warm,dry     Labs/imaging that I havepersonally reviewed  (right click and "Reselect all SmartList Selections" daily)     ASSESSMENT AND PLAN SYNOPSIS  35 yo Hispanic male with severe hypoxic respiratory failure with severe pneumonia with ETOH abuse and liver cirrhosis  Severe ACUTE Hypoxic and Hypercapnic Respiratory Failure  Vent Mode: Bi-Vent FiO2 (%):  [80 %-100 %] 100 % Set Rate:  [30 bmp] 30 bmp Vt Set:  [400 mL] 400 mL PEEP:  [8 cmH20-14 cmH20] 8 cmH20  Trial of LEFT lung UP, heavy wedge to the right There is bound to be severe shunt physiology in the left base Prone apparently precluded by belly habitus? Meanwhile will trial in APRV to recruit lung Tolerate permissive hypercapnia for pH>/=7.25 The patient being young has a strong chest wall and was actually pulling below baseline until pLO set 7-8 Will maintain this unconventional setting to prevent de-recruitment  while at pLO Fentanyl not maintaining comfort, will move to Dilaudid Move to Zosyn for polymicrobial cover given history of possible aspiration/EtOH use Prognosis will be poor if the left base does not recruit.   SEVERE ALCOHOL ABUSE  -Therapy with Thiamine and MVI -Replace CIWA with standing Valium  -High risk for death  CARDIAC Remains tachycardic ICU monitoring  NEUROLOGY Acute toxic metabolic encephalopathy, need for sedation Goal RASS -3 for now, mitigate tachypnea  INFECTIOUS DISEASE -continue antibiotics as prescribed, see above -follow up cultures -R/o TB but doubt active TB, would appear more of a lobar infiltrate from more typical bacteria  ENDO - ICU hypoglycemic\Hyperglycemia protocol - check FSBS per protocol  GI-LIVER CIRRHOSIS GI PROPHYLAXIS as indicated  NUTRITIONAL STATUS DIET-->TF's as tolerated Constipation  protocol as indicated  RENAL/FEN Lab Results  Component Value Date   CREATININE 0.70 11/11/2021   BUN 27 (H) 11/11/2021   NA 134 (L) 11/11/2021   K 3.7 11/11/2021   CL 104 11/11/2021   CO2 26 11/11/2021    Intake/Output Summary (Last 24 hours) at 11/11/2021 1621 Last data filed at 11/11/2021 1600 Gross per 24 hour  Intake 2673.81 ml  Output 2290 ml  Net 383.81 ml   Net IO Since Admission: 532.56 mL [11/11/21 1621] -follow labs as needed -replace as needed -pharmacy consultation and following  ACUTE ANEMIA- TRANSFUSE AS NEEDED CONSIDER TRANSFUSION  IF HGB<7 DVT PRX with TED/SCD's ONLY   Best practice (right click and "Reselect all SmartList Selections" daily)  Diet: NPO Pain/Anxiety/Delirium protocol (if indicated): Yes (RASS goal -2) VAP protocol (if indicated): Yes DVT prophylaxis: LMWH GI prophylaxis: PPI Glucose control:  SSI No Central venous access:  Yes, and it is still needed Arterial line:  N/A Foley:  Yes, and it is still needed Mobility:  bed rest  Code Status:  FULL Disposition:ICU  Labs   CBC: Recent Labs  Lab November 27, 2021 1122 11/10/21 0432 11/11/21 0405  WBC 5.2 9.5 7.9  NEUTROABS 4.1  --   --  HGB 12.9* 11.5* 10.1*  HCT 38.3* 35.3* 32.2*  MCV 85.7 90.1 92.3  PLT 96* 99* 74*     Basic Metabolic Panel: Recent Labs  Lab 2021-11-29 1122 2021-11-29 1340 11-29-21 1600 2021-11-29 1840 11/10/21 0032 11/10/21 0432 11/10/21 1027 11/10/21 1446 11/10/21 2009 11/11/21 0005 11/11/21 0405 11/11/21 0944 11/11/21 1234  NA 117* 118* 121*   < > 121* 124*   < > 130* 132* 132* 134* 133* 134*  K 2.8* 2.6*  --   --  3.5 3.4*  --  3.5  --   --  3.7  --   --   CL 87* 91*  --   --   --  95*  --   --   --   --  104  --   --   CO2 21* 19*  --   --   --  22  --   --   --   --  26  --   --   GLUCOSE 144* 107*  --   --   --  148*  --   --   --   --  113*  --   --   BUN 41* 38*  --   --   --  34*  --   --   --   --  27*  --   --   CREATININE 1.02 0.95  --   --    --  0.89  --   --   --   --  0.70  --   --   CALCIUM 7.8* 6.8*  --   --   --  7.3*  --   --   --   --  7.9*  --   --   MG  --   --  1.7  --   --  2.6*  --   --   --   --  3.1*  --   --   PHOS  --   --  1.2*  --   --  3.5  --   --   --   --  1.9*  --   --    < > = values in this interval not displayed.    GFR: Estimated Creatinine Clearance: 148.1 mL/min (by C-G formula based on SCr of 0.7 mg/dL). Recent Labs  Lab 2021-11-29 1121 11/29/2021 1122 11/29/2021 1340 2021/11/29 1410 11/10/21 0432 11/11/21 0405  PROCALCITON  --   --  3.83  --   --   --   WBC  --  5.2  --   --  9.5 7.9  LATICACIDVEN 2.7*  --   --  1.7  --   --      Liver Function Tests: Recent Labs  Lab 11-29-2021 1122 11/10/21 0432 11/11/21 0405  AST 58* 52* 65*  ALT ALKPHOS 22* 26* 42  BILITOT 3.8* 2.1* 1.5*  PROT 8.7* 7.6 7.0  ALBUMIN 3.0* 2.3* 2.6*    Recent Labs  Lab November 29, 2021 1533  LIPASE 30    No results for input(s): AMMONIA in the last 168 hours.  ABG    Component Value Date/Time   PHART 7.33 (L) 11/11/2021 0800   PCO2ART 48 11/11/2021 0800   PO2ART 70 (L) 11/11/2021 0800   HCO3 25.3 11/11/2021 0800   ACIDBASEDEF 1.0 11/11/2021 0800   O2SAT 92.5 11/11/2021 0800      Coagulation Profile: Recent Labs  Lab 11/29/21 1122  INR 1.4*     Cardiac Enzymes: No results for input(s): CKTOTAL, CKMB, CKMBINDEX, TROPONINI in the last 168 hours.  HbA1C: No results found for: HGBA1C  CBG: Recent Labs  Lab 11/21/2021 1716  GLUCAP 130*      DVT/GI PRX  assessed I Assessed the need for Labs I Assessed the need for Foley I Assessed the need for Central Venous Line Family Discussion when available I Assessed the need for Mobilization I made an Assessment of medications to be adjusted accordingly Safety Risk assessment completed  CASE DISCUSSED IN MULTIDISCIPLINARY ROUNDS WITH ICU TEAM   Critical care was necessary to treat /prevent imminent and life-threatening  deterioration.   PATIENT WITH VERY POOR PROGNOSIS I ANTICIPATE PROLONGED ICU LOS  Patient is critically ill. Patient with Multiorgan failure and at high risk for cardiac arrest and death.   Critical Care Time devoted to patient care services described in this note is 90 minutes.  Overall, patient is critically ill, prognosis is guarded.   Patient with Multiorgan failure and at high risk for cardiac arrest and death.  Images reviewed directly and interpretation in A&P is my own unless noted Labs reviewed and evaluated as noted in A&P

## 2021-11-12 DIAGNOSIS — A403 Sepsis due to Streptococcus pneumoniae: Principal | ICD-10-CM

## 2021-11-12 DIAGNOSIS — I81 Portal vein thrombosis: Secondary | ICD-10-CM

## 2021-11-12 DIAGNOSIS — N179 Acute kidney failure, unspecified: Secondary | ICD-10-CM

## 2021-11-12 LAB — CULTURE, BLOOD (ROUTINE X 2)
Special Requests: ADEQUATE
Special Requests: ADEQUATE

## 2021-11-12 LAB — CULTURE, RESPIRATORY W GRAM STAIN: Culture: NORMAL

## 2021-11-12 LAB — BLOOD GAS, ARTERIAL
Acid-base deficit: 8.1 mmol/L — ABNORMAL HIGH (ref 0.0–2.0)
Acid-base deficit: 5.3 mmol/L — ABNORMAL HIGH (ref 0.0–2.0)
Bicarbonate: 22.9 mmol/L (ref 20.0–28.0)
Bicarbonate: 24.5 mmol/L (ref 20.0–28.0)
FIO2: 1
FIO2: 1
O2 Saturation: 92.7 %
O2 Saturation: 89.6 %
Patient temperature: 37
Patient temperature: 37
pCO2 arterial: 79 mmHg (ref 32.0–48.0)
pCO2 arterial: 72 mmHg (ref 32.0–48.0)
pH, Arterial: 7.07 — CL (ref 7.350–7.450)
pH, Arterial: 7.14 — CL (ref 7.350–7.450)
pO2, Arterial: 91 mmHg (ref 83.0–108.0)
pO2, Arterial: 75 mmHg — ABNORMAL LOW (ref 83.0–108.0)

## 2021-11-12 LAB — CBC WITH DIFFERENTIAL/PLATELET
Abs Immature Granulocytes: 0.6 10*3/uL — ABNORMAL HIGH (ref 0.00–0.07)
Band Neutrophils: 2 %
Basophils Absolute: 0.3 10*3/uL — ABNORMAL HIGH (ref 0.0–0.1)
Basophils Relative: 1 %
Eosinophils Absolute: 0.3 10*3/uL (ref 0.0–0.5)
Eosinophils Relative: 1 %
HCT: 34.9 % — ABNORMAL LOW (ref 39.0–52.0)
Hemoglobin: 10.4 g/dL — ABNORMAL LOW (ref 13.0–17.0)
Lymphocytes Relative: 9 %
Lymphs Abs: 2.9 10*3/uL (ref 0.7–4.0)
MCH: 29.2 pg (ref 26.0–34.0)
MCHC: 29.8 g/dL — ABNORMAL LOW (ref 30.0–36.0)
MCV: 98 fL (ref 80.0–100.0)
Monocytes Absolute: 1 10*3/uL (ref 0.1–1.0)
Monocytes Relative: 3 %
Neutro Abs: 27.1 10*3/uL — ABNORMAL HIGH (ref 1.7–7.7)
Neutrophils Relative %: 82 %
Platelets: 126 10*3/uL — ABNORMAL LOW (ref 150–400)
Promyelocytes Relative: 2 %
RBC: 3.56 MIL/uL — ABNORMAL LOW (ref 4.22–5.81)
RDW: 17.4 % — ABNORMAL HIGH (ref 11.5–15.5)
WBC: 32.3 10*3/uL — ABNORMAL HIGH (ref 4.0–10.5)
nRBC: 0.6 % — ABNORMAL HIGH (ref 0.0–0.2)

## 2021-11-12 LAB — BASIC METABOLIC PANEL
Anion gap: 7 (ref 5–15)
BUN: 50 mg/dL — ABNORMAL HIGH (ref 6–20)
CO2: 23 mmol/L (ref 22–32)
Calcium: 7.7 mg/dL — ABNORMAL LOW (ref 8.9–10.3)
Chloride: 105 mmol/L (ref 98–111)
Creatinine, Ser: 1.96 mg/dL — ABNORMAL HIGH (ref 0.61–1.24)
GFR, Estimated: 45 mL/min — ABNORMAL LOW (ref >60)
Glucose, Bld: 103 mg/dL — ABNORMAL HIGH (ref 70–99)
Potassium: 3.9 mmol/L (ref 3.5–5.1)
Sodium: 135 mmol/L (ref 135–145)

## 2021-11-12 LAB — QUANTIFERON-TB GOLD PLUS (RQFGPL)
QuantiFERON Mitogen Value: 0.04 IU/mL
QuantiFERON Nil Value: 0 IU/mL
QuantiFERON TB1 Ag Value: 0 IU/mL
QuantiFERON TB2 Ag Value: 0 IU/mL

## 2021-11-12 LAB — QUANTIFERON-TB GOLD PLUS: QuantiFERON-TB Gold Plus: UNDETERMINED — AB

## 2021-11-12 LAB — HEPATIC FUNCTION PANEL
ALT: 39 U/L (ref 0–44)
AST: 153 U/L — ABNORMAL HIGH (ref 15–41)
Albumin: 2.5 g/dL — ABNORMAL LOW (ref 3.5–5.0)
Alkaline Phosphatase: 79 U/L (ref 38–126)
Bilirubin, Direct: 1.6 mg/dL — ABNORMAL HIGH (ref 0.0–0.2)
Indirect Bilirubin: 1.2 mg/dL — ABNORMAL HIGH (ref 0.3–0.9)
Total Bilirubin: 2.8 mg/dL — ABNORMAL HIGH (ref 0.3–1.2)
Total Protein: 6.9 g/dL (ref 6.5–8.1)

## 2021-11-12 LAB — ACID FAST SMEAR (AFB, MYCOBACTERIA): Acid Fast Smear: NEGATIVE

## 2021-11-12 LAB — MAGNESIUM: Magnesium: 3.1 mg/dL — ABNORMAL HIGH (ref 1.7–2.4)

## 2021-11-12 LAB — FUNGITELL, SERUM: Fungitell Result: <31 pg/mL (ref <80)

## 2021-11-12 LAB — PHOSPHORUS: Phosphorus: 6.6 mg/dL — ABNORMAL HIGH (ref 2.5–4.6)

## 2021-11-12 LAB — OCCULT BLOOD X 1 CARD TO LAB, STOOL: Fecal Occult Bld: POSITIVE — AB

## 2021-11-12 LAB — SODIUM
Sodium: 137 mmol/L (ref 135–145)
Sodium: 135 mmol/L (ref 135–145)

## 2021-11-12 LAB — LACTATE DEHYDROGENASE: LDH: 382 U/L — ABNORMAL HIGH (ref 98–192)

## 2021-11-12 MED ORDER — SODIUM CHLORIDE 0.9 % IV SOLN
1.0000 g | Freq: Three times a day (TID) | INTRAVENOUS | Status: DC
  Administered 2021-11-12 – 2021-11-13 (×4): 1 g via INTRAVENOUS
  Filled 2021-11-12 (×5): qty 1

## 2021-11-12 MED ORDER — SODIUM BICARBONATE 8.4 % IV SOLN
50.0000 meq | INTRAVENOUS | Status: AC
  Administered 2021-11-12 (×2): 50 meq via INTRAVENOUS
  Filled 2021-11-12: qty 100

## 2021-11-12 MED ORDER — PANTOPRAZOLE SODIUM 40 MG IV SOLR
40.0000 mg | Freq: Two times a day (BID) | INTRAVENOUS | Status: DC
  Administered 2021-11-12 – 2021-11-13 (×4): 40 mg via INTRAVENOUS
  Filled 2021-11-12 (×4): qty 40

## 2021-11-12 MED ORDER — SODIUM BICARBONATE 8.4 % IV SOLN: 150.0000 mg | Freq: Once | INTRAVENOUS | Status: DC

## 2021-11-12 MED ORDER — ALBUMIN HUMAN 25 % IV SOLN
12.5000 g | Freq: Once | INTRAVENOUS | Status: AC
  Administered 2021-11-12: 06:00:00 12.5 g via INTRAVENOUS
  Filled 2021-11-12: qty 50

## 2021-11-12 MED ORDER — IBUPROFEN 100 MG/5ML PO SUSP
600.0000 mg | ORAL | Status: AC
  Administered 2021-11-12: 16:00:00 600 mg
  Filled 2021-11-12: qty 30

## 2021-11-12 MED ORDER — LINEZOLID 600 MG/300ML IV SOLN
600.0000 mg | Freq: Two times a day (BID) | INTRAVENOUS | Status: DC
  Administered 2021-11-12 – 2021-11-13 (×4): 600 mg via INTRAVENOUS
  Filled 2021-11-12 (×5): qty 300

## 2021-11-12 MED ORDER — ACETAMINOPHEN 500 MG PO TABS
1000.0000 mg | ORAL_TABLET | Freq: Once | ORAL | Status: AC
  Administered 2021-11-12: 08:00:00 1000 mg
  Filled 2021-11-12: qty 2

## 2021-11-12 MED ORDER — SODIUM BICARBONATE 8.4 % IV SOLN
50.0000 meq | INTRAVENOUS | Status: AC
  Administered 2021-11-12 (×3): 50 meq via INTRAVENOUS
  Filled 2021-11-12: qty 100
  Filled 2021-11-12: qty 50

## 2021-11-12 MED ORDER — VASOPRESSIN 20 UNITS/100 ML INFUSION FOR SHOCK
0.0000 [IU]/min | INTRAVENOUS | Status: DC
Start: 2021-11-12 — End: 2021-11-14
  Administered 2021-11-12 (×2): 0.03 [IU]/min via INTRAVENOUS
  Administered 2021-11-13: 0.04 [IU]/min via INTRAVENOUS
  Administered 2021-11-13: 0.03 [IU]/min via INTRAVENOUS
  Administered 2021-11-13 – 2021-11-14 (×2): 0.04 [IU]/min via INTRAVENOUS
  Filled 2021-11-12 (×7): qty 100

## 2021-11-12 MED ORDER — LACTATED RINGERS IV BOLUS
1000.0000 mL | Freq: Once | INTRAVENOUS | Status: AC
  Administered 2021-11-12: 08:00:00 1000 mL via INTRAVENOUS

## 2021-11-12 NOTE — Progress Notes (Signed)
Date of Admission:  11/21/2021   : Adam White is a 35 y.o. male Principal Problem:   Community acquired pneumonia Active Problems:   Acute respiratory failure with hypoxia (HCC)    Subjective: Remains critically ill, intubated Family at bedside. Worsening fever and leukocytosis today.  Medications:   chlorhexidine gluconate (MEDLINE KIT)  15 mL Mouth Rinse BID   Chlorhexidine Gluconate Cloth  6 each Topical Daily   diazepam  5 mg Intravenous Q6H   docusate  100 mg Per Tube BID   feeding supplement (PROSource TF)  90 mL Per Tube QID   feeding supplement (VITAL HIGH PROTEIN)  1,000 mL Per Tube X51Z   folic acid  1 mg Per Tube Daily   free water  30 mL Per Tube Q4H   mouth rinse  15 mL Mouth Rinse 10 times per day   multivitamin with minerals  1 tablet Per Tube Daily   pantoprazole (PROTONIX) IV  40 mg Intravenous Q12H   polyethylene glycol  17 g Per Tube Daily   thiamine injection  100 mg Intravenous Q24H    Objective: Vital signs in last 24 hours: Temp:  [95.9 F (35.5 C)-103.5 F (39.7 C)] 101.5 F (38.6 C) (12/14 1345) Pulse Rate:  [116-133] 118 (12/14 1345) Resp:  [9-60] 20 (12/14 1345) SpO2:  [84 %-98 %] 95 % (12/14 1345) Arterial Line BP: (85-137)/(36-70) 100/51 (12/14 1345) FiO2 (%):  [100 %] 100 % (12/14 1133)  PHYSICAL EXAM:  General: Intubated and sedated.   Head: Normocephalic, without obvious abnormality, atraumatic. Eyes: Conjunctivae clear, anicteric sclerae. Pupils are equal Lungs: Bilateral air entry.  Crepitations both lungs.. Heart: Tachycardia.    Abdomen: Soft, distended. Bowel sounds normal. No masses Extremities: Edema legs  skin: No rashes or lesions. Or bruising Lymph: Cervical, supraclavicular normal. Neurologic: Cannot be assessed  Lab Results Recent Labs    11/11/21 0405 11/11/21 0944 11/12/21 0403 11/12/21 0916  WBC 7.9  --  32.3*  --   HGB 10.1*  --  10.4*  --   HCT 32.2*  --  34.9*  --   NA 134*   < > 135  135  K 3.7  --  3.9  --   CL 104  --  105  --   CO2 26  --  23  --   BUN 27*  --  50*  --   CREATININE 0.70  --  1.96*  --    < > = values in this interval not displayed.   Liver Panel Recent Labs    11/10/21 0432 11/11/21 0405  PROT 7.6 7.0  ALBUMIN 2.3* 2.6*  AST 52* 65*  ALT 21 23  ALKPHOS 26* 42  BILITOT 2.1* 1.5*  BILIDIR  --  0.9*  IBILI  --  0.6    Microbiology: 12,10 blood culture strep pneumo 12/11 blood culture strep pneumo 12/11 blood cultures in the evening no growth so far 12/14 blood cultures have been repeated Studies/Results: Ultrasound liver shows portal vein thrombosis of the liver hilum with Doppler ultrasound.  Hepatomegaly with nodular liver contour suspicious for cirrhosis. CT abdomen shows bilateral lower lobe consolidation with air bronchograms concerning for bilateral pneumonia.  Assessment/Plan: Severe sepsis with Streptococcus pneumonia bacteremia and bilateral pneumonia which is community-acquired  Acute hypoxic respiratory failure intubated.  Portal vein thrombosis. Noted liver cirrhosis Ascites  Worsening leukocytosis and fever today Differential diagnosis includes worsening thrombosis of the portal vein system leading to mesenteric ischemia versus liver ischemia  Abscess formation in the lung versus empyema Patient's antibiotic has been escalated by the ICU team.  Initially was on ceftriaxone then switched over to Zosyn yesterday and today is on meropenem and linezolid. Would recommend getting a CT chest as well as MRA or CTA of the abdomen when stable.  Respiratory acidosis with CO2 retention.  Patient had epistaxis when he was started on IV heparin PT from 1211 2217.3 and PTT is 39.   Platelet is improved to 126. Less likely to be DIC.  Discussed the management with the care team.

## 2021-11-12 NOTE — Progress Notes (Signed)
NAME:  Adam White, MRN:  [id number], DOB:  10-20-1986, LOS: 3 ADMISSION DATE:  11/02/2021  BRIEF SYNOPSIS 35 y.o with significant PMH of EtOH abuse and  thrombocytopenia who presented to the ED with chief complaints of progressive shortness of breath. +ETOH ABUSE, +liver cirrhosis   CT chest with severe b/l opacities c/w pneumonia Progressive resp failure and intubated    Significant Hospital Events: Including procedures, antibiotic start and stop dates in addition to other pertinent events   12/11 severe resp failure 12/12 severe resp failure, remains on  vent     Significant Diagnostic Tests:  12/11: Chest Xray>Rounded density is noted in right lower lobe concerning for mass or nodule. Nodular opacities are seen in the left midlung. Some degree of left pleural effusion is noted as well. CT scan of the chest is recommended for further evaluation. 12/11: CTA chest>Bilateral pulmonary pulmonary masses and confluent nodularity. Dense near complete consolidation of the LEFT lower lobe with associated LEFT effusion. Findings are concerning for pulmonary malignancy. Consider LYMPHOMA. Granulomatous disease or infectious process is much less favored. 12/11: CTA abdomen and pelvis>   Micro Data:  12/11: SARS-CoV-2 PCR> negative 12/11: Influenza PCR> negative 12/11: Blood culture x2> 12/11: Urine Culture> 12/11: MRSA PCR>>  12/11: Strep pneumo urinary antigen> 12/11: Legionella urinary antigen> 12/11: Mycoplasma pneumonia>   Antimicrobials:  Vancomycin 12/11> Azithromycin 12/11> Ceftriaxone 12/11>   Antimicrobials:   Antibiotics Given (last 72 hours)     Date/Time Action Medication Dose Rate   11/27/2021 1456 New Bag/Given   vancomycin (VANCOCIN) IVPB 1000 mg/200 mL premix 1,000 mg 200 mL/hr   11/24/2021 1807 New Bag/Given   vancomycin (VANCOCIN) IVPB 1000 mg/200 mL premix 1,000 mg 200 mL/hr   11/08/2021 2124 New Bag/Given   piperacillin-tazobactam (ZOSYN) IVPB  3.375 g 3.375 g 12.5 mL/hr   11/10/21 0539 New Bag/Given   piperacillin-tazobactam (ZOSYN) IVPB 3.375 g 3.375 g 12.5 mL/hr   11/10/21 0913 New Bag/Given   vancomycin (VANCOCIN) IVPB 1000 mg/200 mL premix 1,000 mg 200 mL/hr   11/10/21 1436 New Bag/Given   cefTRIAXone (ROCEPHIN) 2 g in sodium chloride 0.9 % 100 mL IVPB 2 g 200 mL/hr   11/11/21 1101 New Bag/Given   piperacillin-tazobactam (ZOSYN) IVPB 3.375 g 3.375 g 12.5 mL/hr   11/11/21 2201 New Bag/Given   piperacillin-tazobactam (ZOSYN) IVPB 3.375 g 3.375 g 12.5 mL/hr   11/12/21 0517 New Bag/Given   piperacillin-tazobactam (ZOSYN) IVPB 3.375 g 3.375 g 12.5 mL/hr   11/12/21 0818 New Bag/Given   linezolid (ZYVOX) IVPB 600 mg 600 mg 300 mL/hr   11/12/21 5643 New Bag/Given   meropenem (MERREM) 1 g in sodium chloride 0.9 % 100 mL IVPB 1 g 200 mL/hr   11/12/21 1410 New Bag/Given   meropenem (MERREM) 1 g in sodium chloride 0.9 % 100 mL IVPB 1 g 200 mL/hr            Interim History / Subjective:  Remains on vent Severe resp failure CT chest reviewed  Vent Mode: Bi-Vent FiO2 (%):  [100 %] 100 % Set Rate:  [20 bmp] 20 bmp PEEP:  [7 cmH20-8 cmH20] 7 cmH20       Objective   Blood pressure 117/61, pulse (!) 118, temperature (!) 101.5 F (38.6 C), resp. rate 20, height 5' 5.98" (1.676 m), weight 103.6 kg, SpO2 95 %.    Vent Mode: Bi-Vent FiO2 (%):  [100 %] 100 % Set Rate:  [20 bmp] 20 bmp PEEP:  [7 cmH20-8 cmH20] 7  cmH20   Intake/Output Summary (Last 24 hours) at 11/12/2021 1412 Last data filed at 11/12/2021 1400 Gross per 24 hour  Intake 3749.11 ml  Output 860 ml  Net 2889.11 ml    Filed Weights   11-28-2021 1726 11/10/21 0437 11/11/21 0457  Weight: 109 kg 108.5 kg 103.6 kg      REVIEW OF SYSTEMS  PATIENT IS UNABLE TO PROVIDE COMPLETE REVIEW OF SYSTEMS DUE TO SEVERE CRITICAL ILLNESS AND TOXIC METABOLIC ENCEPHALOPATHY   PHYSICAL EXAMINATION:  GENERAL:critically ill appearing, +resp distress EYES: Pupils  equal, round, reactive to light.  No scleral icterus.  MOUTH: Moist mucosal membrane. INTUBATED NECK: Supple.  PULMONARY: +rhonchi, +wheezing CARDIOVASCULAR: S1 and S2.  No murmurs  GASTROINTESTINAL: Soft, nontender, -distended. Positive bowel sounds.  MUSCULOSKELETAL: No swelling, clubbing, or edema.  NEUROLOGIC: obtunded SKIN:intact,warm,dry     Labs/imaging that I havepersonally reviewed  (right click and "Reselect all SmartList Selections" daily)     ASSESSMENT AND PLAN SYNOPSIS  35 yo Hispanic male with severe hypoxic respiratory failure with severe pneumonia with ETOH abuse and liver cirrhosis  Severe ACUTE Hypoxic and Hypercapnic Respiratory Failure  Vent Mode: Bi-Vent FiO2 (%):  [100 %] 100 % Set Rate:  [20 bmp] 20 bmp PEEP:  [7 cmH20-8 cmH20] 7 cmH20  Trial of LEFT lung UP, heavy wedge to the right There is bound to be severe shunt physiology in the left base Prone apparently precluded by belly habitus? Meanwhile will trial in APRV to recruit lung Tolerate permissive hypercapnia for pH>/=7.25, bicarb as needed The patient being young has a strong chest wall and was actually pulling below baseline until pLO set 7-8 Will maintain this unconventional setting to prevent de-recruitment  while at pLO Fentanyl not maintaining comfort, will move to Dilaudid Change to Merrem and Zyvox for polymicrobial cover given history of possible aspiration/EtOH use Attempt high parenchymal concentration of antibiosis while respecting renal limits Prognosis will be poor if the left base does not recruit.   SEVERE ALCOHOL ABUSE  -Therapy with Thiamine and MVI -Replace CIWA with standing Valium  -High risk for death  CARDIAC Remains tachycardic ICU monitoring New pressor requirement today, Levo over 30 this morning  Re-Cx today  NEUROLOGY Acute toxic metabolic encephalopathy, need for sedation Goal RASS -3 for now, mitigate tachypnea  INFECTIOUS DISEASE Lab Results   Component Value Date   WBC 32.3 (H) 11/12/2021    -continue antibiotics as prescribed, see above -follow up cultures -R/o TB but doubt active TB, would appear more of a lobar infiltrate from more typical bacteria -Re-culture today for completeness   ENDO - ICU hypoglycemic\Hyperglycemia protocol - check FSBS per protocol  GI-LIVER CIRRHOSIS Thrombosis of portal vein Intolerance to anticoagulation with severe unrelenting epistaxis  Likely extension of thrombus with faltering LFTs, fever, AKI GI PROPHYLAXIS as indicated  NUTRITIONAL STATUS DIET-->TF's as tolerated Constipation protocol as indicated  RENAL/FEN  AKI Lab Results  Component Value Date   CREATININE 1.96 (H) 11/12/2021   BUN 50 (H) 11/12/2021   NA 135 11/12/2021   K 3.9 11/12/2021   CL 105 11/12/2021   CO2 23 11/12/2021    Intake/Output Summary (Last 24 hours) at 11/12/2021 1412 Last data filed at 11/12/2021 1400 Gross per 24 hour  Intake 3749.11 ml  Output 860 ml  Net 2889.11 ml    Net IO Since Admission: 3,477.63 mL [11/12/21 1412] -Fluid replace,ment today given altered BP -follow labs as needed -replace as needed -pharmacy consultation and following  ACUTE ANEMIA- TRANSFUSE AS  NEEDED CONSIDER TRANSFUSION  IF HGB<7 DVT PRX with TED/SCD's ONLY   Best practice (right click and "Reselect all SmartList Selections" daily)  Diet: NPO Pain/Anxiety/Delirium protocol (if indicated): Yes (RASS goal -2) VAP protocol (if indicated): Yes DVT prophylaxis: LMWH GI prophylaxis: PPI Glucose control:  SSI No Central venous access:  Yes, and it is still needed Arterial line:  N/A Foley:  Yes, and it is still needed Mobility:  bed rest  Code Status:  FULL Disposition:ICU  Labs   CBC: Recent Labs  Lab Nov 21, 2021 1122 11/10/21 0432 11/11/21 0405 11/12/21 0403  WBC 5.2 9.5 7.9 32.3*  NEUTROABS 4.1  --   --  27.1*  HGB 12.9* 11.5* 10.1* 10.4*  HCT 38.3* 35.3* 32.2* 34.9*  MCV 85.7 90.1 92.3 98.0   PLT 96* 99* 74* 126*     Basic Metabolic Panel: Recent Labs  Lab Nov 21, 2021 1122 21-Nov-2021 1340 11/21/21 1600 2021-11-21 1840 11/10/21 0032 11/10/21 0432 11/10/21 1027 11/10/21 1446 11/10/21 2009 11/11/21 0405 11/11/21 0944 11/11/21 1601 11/11/21 1957 11/12/21 0028 11/12/21 0403 11/12/21 0916  NA 117* 118* 121*   < > 121* 124*   < > 130*   < > 134*   < > 135 135 137 135 135  K 2.8* 2.6*  --   --  3.5 3.4*  --  3.5  --  3.7  --   --   --   --  3.9  --   CL 87* 91*  --   --   --  95*  --   --   --  104  --   --   --   --  105  --   CO2 21* 19*  --   --   --  22  --   --   --  26  --   --   --   --  23  --   GLUCOSE 144* 107*  --   --   --  148*  --   --   --  113*  --   --   --   --  103*  --   BUN 41* 38*  --   --   --  34*  --   --   --  27*  --   --   --   --  50*  --   CREATININE 1.02 0.95  --   --   --  0.89  --   --   --  0.70  --   --   --   --  1.96*  --   CALCIUM 7.8* 6.8*  --   --   --  7.3*  --   --   --  7.9*  --   --   --   --  7.7*  --   MG  --   --  1.7  --   --  2.6*  --   --   --  3.1*  --   --   --   --  3.1*  --   PHOS  --   --  1.2*  --   --  3.5  --   --   --  1.9*  --   --   --   --  6.6*  --    < > = values in this interval not displayed.    GFR: Estimated Creatinine Clearance: 60.4 mL/min (A) (by C-G formula based on SCr  of 1.96 mg/dL (H)). Recent Labs  Lab 2021-11-13 1121 13-Nov-2021 1122 November 13, 2021 1340 11/13/21 1410 11/10/21 0432 11/11/21 0405 11/12/21 0403  PROCALCITON  --   --  3.83  --   --   --   --   WBC  --  5.2  --   --  9.5 7.9 32.3*  LATICACIDVEN 2.7*  --   --  1.7  --   --   --      Liver Function Tests: Recent Labs  Lab November 13, 2021 1122 11/10/21 0432 11/11/21 0405  AST 58* 52* 65*  ALT 25 21 23   ALKPHOS 22* 26* 42  BILITOT 3.8* 2.1* 1.5*  PROT 8.7* 7.6 7.0  ALBUMIN 3.0* 2.3* 2.6*    Recent Labs  Lab 11/13/2021 1533  LIPASE 30    No results for input(s): AMMONIA in the last 168 hours.  ABG    Component Value Date/Time    PHART 7.07 (LL) 11/12/2021 1044   PCO2ART 79 (HH) 11/12/2021 1044   PO2ART 91 11/12/2021 1044   HCO3 22.9 11/12/2021 1044   ACIDBASEDEF 8.1 (H) 11/12/2021 1044   O2SAT 92.7 11/12/2021 1044      Coagulation Profile: Recent Labs  Lab 11-13-21 1122  INR 1.4*     Cardiac Enzymes: No results for input(s): CKTOTAL, CKMB, CKMBINDEX, TROPONINI in the last 168 hours.  HbA1C: No results found for: HGBA1C  CBG: Recent Labs  Lab 11-13-2021 1716  GLUCAP 130*      DVT/GI PRX  assessed I Assessed the need for Labs I Assessed the need for Foley I Assessed the need for Central Venous Line Family Discussion when available I Assessed the need for Mobilization I made an Assessment of medications to be adjusted accordingly Safety Risk assessment completed  CASE DISCUSSED IN MULTIDISCIPLINARY ROUNDS WITH ICU TEAM   Critical care was necessary to treat /prevent imminent and life-threatening deterioration.   PATIENT WITH VERY POOR PROGNOSIS I ANTICIPATE PROLONGED ICU LOS  Patient is critically ill. Patient with Multiorgan failure and at high risk for cardiac arrest and death.   Critical Care Time devoted to patient care services described in this note is 90 minutes.  Overall, patient is critically ill, prognosis is guarded.   Patient with Multiorgan failure and at high risk for cardiac arrest and death.  Images reviewed directly and interpretation in A&P is my own unless noted Labs reviewed and evaluated as noted in A&P

## 2021-11-12 NOTE — Consult Note (Signed)
PHARMACY CONSULT NOTE  Pharmacy Consult for Electrolyte Monitoring and Replacement   Recent Labs: Potassium (mmol/L)  Date Value  11/12/2021 3.9   Magnesium (mg/dL)  Date Value  36/10/2448 3.1 (H)   Calcium (mg/dL)  Date Value  75/30/0511 7.7 (L)   Albumin (g/dL)  Date Value  01/10/1734 2.6 (L)   Phosphorus (mg/dL)  Date Value  67/11/4101 6.6 (H)   Sodium (mmol/L)  Date Value  11/12/2021 135    Assessment: Patient is a 35 y/o M with history of alcohol use disorder who presented to the ED 12/11 with pleuritic chest pain. CTA interpretation with bilateral pulmonary masses and associated left effusion concerning for malignancy with infectious process or granulomatous disease felt to be less likely. Appears patient was intubated in the ED. Pharmacy consulted to assist with electrolyte monitoring and replacement as indicated.  Labs  Na 137 (0028)>135 (0403)> 135 (0916) K 3.4>3.5>3.7>3.9 Phos 3.5>1.9>6.6 Mg 2.6>3.1>3.1  Goal of Therapy:  Electrolytes within normal limits  Plan:  --Na normalized, will transition to daily monitoring  --No other replacement is indicated at this time --Will continue to monitor electrolytes and f/u with AM labs  Doloris Hall, PharmD Pharmacy Resident  11/12/2021 7:50 AM

## 2021-11-12 NOTE — Progress Notes (Signed)
°   11/12/21 2100  Clinical Encounter Type  Visited With Patient and family together  Visit Type Initial;Spiritual support  Referral From Nurse  Consult/Referral To Chaplain  Spiritual Encounters  Spiritual Needs Prayer;Emotional;Grief support  Chaplain Oleta Mouse responded to a page in ICU-20, Pt [employer]. Family requested that I pray and anoint their loved one with [employer]. Afterwards I provided reflective listening, spiritual and emotional support as the family members gathered around the Pt's bedside sharing love and care.

## 2021-11-13 ENCOUNTER — Inpatient Hospital Stay: Payer: Medicaid Other

## 2021-11-13 DIAGNOSIS — A491 Streptococcal infection, unspecified site: Secondary | ICD-10-CM

## 2021-11-13 LAB — CBC WITH DIFFERENTIAL/PLATELET
Abs Immature Granulocytes: 3.49 10*3/uL — ABNORMAL HIGH (ref 0.00–0.07)
Basophils Absolute: 0.1 10*3/uL (ref 0.0–0.1)
Basophils Relative: 0 %
Eosinophils Absolute: 0.6 10*3/uL — ABNORMAL HIGH (ref 0.0–0.5)
Eosinophils Relative: 2 %
HCT: 34.7 % — ABNORMAL LOW (ref 39.0–52.0)
Hemoglobin: 10.4 g/dL — ABNORMAL LOW (ref 13.0–17.0)
Immature Granulocytes: 10 %
Lymphocytes Relative: 8 %
Lymphs Abs: 2.8 10*3/uL (ref 0.7–4.0)
MCH: 29.5 pg (ref 26.0–34.0)
MCHC: 30 g/dL (ref 30.0–36.0)
MCV: 98.3 fL (ref 80.0–100.0)
Monocytes Absolute: 1.3 10*3/uL — ABNORMAL HIGH (ref 0.1–1.0)
Monocytes Relative: 4 %
Neutro Abs: 26.8 10*3/uL — ABNORMAL HIGH (ref 1.7–7.7)
Neutrophils Relative %: 76 %
Platelets: 175 10*3/uL (ref 150–400)
RBC: 3.53 MIL/uL — ABNORMAL LOW (ref 4.22–5.81)
RDW: 17.6 % — ABNORMAL HIGH (ref 11.5–15.5)
Smear Review: NORMAL
WBC: 35 10*3/uL — ABNORMAL HIGH (ref 4.0–10.5)
nRBC: 0.5 % — ABNORMAL HIGH (ref 0.0–0.2)

## 2021-11-13 LAB — LACTIC ACID, PLASMA
Lactic Acid, Venous: 1.9 mmol/L (ref 0.5–1.9)
Lactic Acid, Venous: 2.5 mmol/L (ref 0.5–1.9)

## 2021-11-13 LAB — BLASTOMYCES ANTIGEN: Blastomyces Antigen: NOT DETECTED ng/mL

## 2021-11-13 LAB — BASIC METABOLIC PANEL
Anion gap: 14 (ref 5–15)
BUN: 84 mg/dL — ABNORMAL HIGH (ref 6–20)
CO2: 24 mmol/L (ref 22–32)
Calcium: 7.3 mg/dL — ABNORMAL LOW (ref 8.9–10.3)
Chloride: 98 mmol/L (ref 98–111)
Creatinine, Ser: 4.75 mg/dL — ABNORMAL HIGH (ref 0.61–1.24)
GFR, Estimated: 16 mL/min — ABNORMAL LOW (ref >60)
Glucose, Bld: 135 mg/dL — ABNORMAL HIGH (ref 70–99)
Potassium: 3.8 mmol/L (ref 3.5–5.1)
Sodium: 136 mmol/L (ref 135–145)

## 2021-11-13 LAB — MAGNESIUM: Magnesium: 3.3 mg/dL — ABNORMAL HIGH (ref 1.7–2.4)

## 2021-11-13 LAB — BLOOD GAS, ARTERIAL
Acid-base deficit: 9.2 mmol/L — ABNORMAL HIGH (ref 0.0–2.0)
Bicarbonate: 20.6 mmol/L (ref 20.0–28.0)
FIO2: 1
O2 Saturation: 89 %
PEEP: 7 cmH2O
PIP: 32 cmH2O
Patient temperature: 37
pCO2 arterial: 65 mmHg — ABNORMAL HIGH (ref 32.0–48.0)
pH, Arterial: 7.11 — CL (ref 7.350–7.450)
pO2, Arterial: 76 mmHg — ABNORMAL LOW (ref 83.0–108.0)

## 2021-11-13 LAB — PROTIME-INR
INR: 1.4 — ABNORMAL HIGH (ref 0.8–1.2)
Prothrombin Time: 16.9 seconds — ABNORMAL HIGH (ref 11.4–15.2)

## 2021-11-13 LAB — TRIGLYCERIDES: Triglycerides: 470 mg/dL — ABNORMAL HIGH (ref <150)

## 2021-11-13 LAB — PHOSPHORUS: Phosphorus: 10.3 mg/dL — ABNORMAL HIGH (ref 2.5–4.6)

## 2021-11-13 LAB — APTT: aPTT: 36 seconds (ref 24–36)

## 2021-11-13 LAB — ACID FAST SMEAR (AFB, MYCOBACTERIA): Acid Fast Smear: NEGATIVE

## 2021-11-13 MED ORDER — MIDAZOLAM-SODIUM CHLORIDE 100-0.9 MG/100ML-% IV SOLN
0.5000 mg/h | INTRAVENOUS | Status: DC
  Administered 2021-11-13: 10 mg/h via INTRAVENOUS
  Administered 2021-11-13: 4 mg/h via INTRAVENOUS
  Administered 2021-11-14: 15 mg/h via INTRAVENOUS
  Filled 2021-11-13 (×3): qty 100

## 2021-11-13 MED ORDER — PHENYLEPHRINE CONCENTRATED 100MG/250ML (0.4 MG/ML) INFUSION SIMPLE
0.0000 ug/min | INTRAVENOUS | Status: DC
  Administered 2021-11-13: 300 ug/min via INTRAVENOUS
  Administered 2021-11-13: 400 ug/min via INTRAVENOUS
  Administered 2021-11-13: 50 ug/min via INTRAVENOUS
  Administered 2021-11-13: 300 ug/min via INTRAVENOUS
  Administered 2021-11-14 (×2): 400 ug/min via INTRAVENOUS
  Filled 2021-11-13 (×6): qty 250

## 2021-11-13 MED ORDER — BLISTEX MEDICATED EX OINT
TOPICAL_OINTMENT | CUTANEOUS | Status: DC | PRN
  Filled 2021-11-13: qty 6.3

## 2021-11-13 MED ORDER — SODIUM CHLORIDE 0.9 % IV SOLN
1.0000 g | Freq: Two times a day (BID) | INTRAVENOUS | Status: DC
Start: 2021-11-13 — End: 2021-11-14
  Administered 2021-11-13: 1 g via INTRAVENOUS
  Filled 2021-11-13 (×2): qty 1

## 2021-11-13 MED ORDER — PHENYLEPHRINE HCL-NACL 20-0.9 MG/250ML-% IV SOLN: 0.0000 ug/min | INTRAVENOUS | Status: DC

## 2021-11-13 MED ORDER — HYDROCORTISONE SOD SUC (PF) 100 MG IJ SOLR
100.0000 mg | Freq: Three times a day (TID) | INTRAMUSCULAR | Status: DC
  Administered 2021-11-13 – 2021-11-14 (×3): 100 mg via INTRAVENOUS
  Filled 2021-11-13 (×3): qty 2

## 2021-11-13 NOTE — Progress Notes (Signed)
NAME:  Chrles Selley, MRN:  [id number], DOB:  [birth year], LOS: 4 ADMISSION DATE:  11/22/2021  BRIEF SYNOPSIS 35 y.o with significant PMH of EtOH abuse and  thrombocytopenia who presented to the ED with chief complaints of progressive shortness of breath. +ETOH ABUSE, +liver cirrhosis   CT chest with severe b/l opacities c/w pneumonia Progressive resp failure and intubated    Significant Hospital Events: Including procedures, antibiotic start and stop dates in addition to other pertinent events   12/11 severe resp failure 12/12 severe resp failure, remains on  vent     Significant Diagnostic Tests:  12/11: Chest Xray>Rounded density is noted in right lower lobe concerning for mass or nodule. Nodular opacities are seen in the left midlung. Some degree of left pleural effusion is noted as well. CT scan of the chest is recommended for further evaluation. 12/11: CTA chest>Bilateral pulmonary pulmonary masses and confluent nodularity. Dense near complete consolidation of the LEFT lower lobe with associated LEFT effusion. Findings are concerning for pulmonary malignancy. Consider LYMPHOMA. Granulomatous disease or infectious process is much less favored. 12/11: CTA abdomen and pelvis>   Micro Data:  12/11: SARS-CoV-2 PCR> negative 12/11: Influenza PCR> negative 12/11: Blood culture x2> 12/11: Urine Culture> 12/11: MRSA PCR>>  12/11: Strep pneumo urinary antigen> 12/11: Legionella urinary antigen> 12/11: Mycoplasma pneumonia>   Antimicrobials:  Vancomycin 12/11> Azithromycin 12/11> Ceftriaxone 12/11>   Antimicrobials:   Antibiotics Given (last 72 hours)     Date/Time Action Medication Dose Rate   11/10/21 1436 New Bag/Given   cefTRIAXone (ROCEPHIN) 2 g in sodium chloride 0.9 % 100 mL IVPB 2 g 200 mL/hr   11/11/21 1101 New Bag/Given   piperacillin-tazobactam (ZOSYN) IVPB 3.375 g 3.375 g 12.5 mL/hr   11/11/21 2201 New Bag/Given   piperacillin-tazobactam (ZOSYN)  IVPB 3.375 g 3.375 g 12.5 mL/hr   11/12/21 0517 New Bag/Given   piperacillin-tazobactam (ZOSYN) IVPB 3.375 g 3.375 g 12.5 mL/hr   11/12/21 0818 New Bag/Given   linezolid (ZYVOX) IVPB 600 mg 600 mg 300 mL/hr   11/12/21 8466 New Bag/Given   meropenem (MERREM) 1 g in sodium chloride 0.9 % 100 mL IVPB 1 g 200 mL/hr   11/12/21 1410 New Bag/Given   meropenem (MERREM) 1 g in sodium chloride 0.9 % 100 mL IVPB 1 g 200 mL/hr   11/12/21 2154 New Bag/Given   meropenem (MERREM) 1 g in sodium chloride 0.9 % 100 mL IVPB 1 g 200 mL/hr   11/12/21 2156 New Bag/Given   linezolid (ZYVOX) IVPB 600 mg 600 mg 300 mL/hr   11/13/21 0547 New Bag/Given   meropenem (MERREM) 1 g in sodium chloride 0.9 % 100 mL IVPB 1 g 200 mL/hr            Interim History / Subjective:  Remains on vent Severe resp failure CT chest reviewed  Vent Mode: Bi-Vent FiO2 (%):  [100 %] 100 % Set Rate:  [20 bmp] 20 bmp PEEP:  [7 cmH20] 7 cmH20 Pressure Support:  [8 cmH20] 8 cmH20 Plateau Pressure:  [31 cmH20] 31 cmH20       Objective   Blood pressure 117/61, pulse (!) 120, temperature (!) 102.2 F (39 C), resp. rate (!) 27, height 5' 5.98" (1.676 m), weight 103.6 kg, SpO2 92 %.    Vent Mode: Bi-Vent FiO2 (%):  [100 %] 100 % Set Rate:  [20 bmp] 20 bmp PEEP:  [7 cmH20] 7 cmH20 Pressure Support:  [8 cmH20] 8 cmH20 Plateau Pressure:  [31 cmH20]  31 cmH20   Intake/Output Summary (Last 24 hours) at 11/13/2021 0933 Last data filed at 11/13/2021 0900 Gross per 24 hour  Intake 3688.72 ml  Output 15 ml  Net 3673.72 ml    Filed Weights   11/10/21 0437 11/11/21 0457 11/13/21 0402  Weight: 108.5 kg 103.6 kg 103.6 kg      REVIEW OF SYSTEMS  PATIENT IS UNABLE TO PROVIDE COMPLETE REVIEW OF SYSTEMS DUE TO SEVERE CRITICAL ILLNESS AND TOXIC METABOLIC ENCEPHALOPATHY   PHYSICAL EXAMINATION:  GENERAL:critically ill appearing, +resp distress EYES: Pupils equal, round, reactive to light.  No scleral icterus.  MOUTH:  Moist mucosal membrane. INTUBATED NECK: Supple.  PULMONARY: +rhonchi, +wheezing CARDIOVASCULAR: S1 and S2.  No murmurs  GASTROINTESTINAL: Soft, nontender, -distended. Positive bowel sounds.  MUSCULOSKELETAL: No swelling, clubbing, or edema.  NEUROLOGIC: obtunded SKIN:intact,warm,dry     Labs/imaging that I havepersonally reviewed  (right click and "Reselect all SmartList Selections" daily)     ASSESSMENT AND PLAN SYNOPSIS  35 yo Hispanic male with severe hypoxic respiratory failure with severe pneumonia with ETOH abuse and liver cirrhosis  Severe ACUTE Hypoxic and Hypercapnic Respiratory Failure    12/15      12/11 Vent Mode: Bi-Vent FiO2 (%):  [100 %] 100 % Set Rate:  [20 bmp] 20 bmp PEEP:  [7 cmH20] 7 cmH20 Pressure Support:  [8 cmH20] 8 cmH20 Plateau Pressure:  [31 cmH20] 31 cmH20  Trial of LEFT lung UP, heavy wedge to the right There is bound to be severe shunt physiology in the left base, but both lungs are infiltrated ARDS likely is the predominant process, but of course there is risk for volume overload Prone apparently precluded by belly habitus? Meanwhile will maintain in APRV to recruit lung 7.11/65/76 this morning, there is little that can be done to improve ventilation without expense of hypoxia  The patient being young has a strong chest wall and was actually pulling below baseline until pLO set 7-8 Will maintain this unconventional setting to prevent de-recruitment  while at pLO Fentanyl not maintaining comfort, will move to Dilaudid Changed to Merrem and Zyvox for polymicrobial cover given history of possible aspiration/EtOH use Attempt high parenchymal concentration of antibiosis while respecting renal limits Prognosis will be poor if the left base does not recruit.   SEVERE ALCOHOL ABUSE  -Therapy with Thiamine and MVI -Replace CIWA with standing Valium  -High risk for death  CARDIAC Remains tachycardic ICU monitoring New pressor requirement  today, Levo over 30 this morning  Re-Cx today  NEUROLOGY Acute toxic metabolic encephalopathy, need for sedation Goal RASS -3 for now, mitigate tachypnea  INFECTIOUS DISEASE Lab Results  Component Value Date   WBC 35.0 (H) 11/13/2021   Temp (24hrs), Avg:101.3 F (38.5 C), Min:99 F (37.2 C), Max:102.2 F (39 C)  -continue antibiotics as prescribed, see above -follow up cultures -R/o TB but doubt active TB, would appear more of a lobar infiltrate from more typical bacteria -Re-culture today for completeness   ENDO - ICU hypoglycemic\Hyperglycemia protocol - check FSBS per protocol  GI-LIVER CIRRHOSIS Thrombosis of portal vein Intolerance to anticoagulation with severe unrelenting epistaxis, FOB, and now bleeding per rectum with flexi-seal Likely extension of thrombus with faltering LFTs, fever, AKI. Extremely poor prognosis  GI PROPHYLAXIS as indicated  NUTRITIONAL STATUS DIET-->TF's as tolerated Constipation protocol as indicated  RENAL/FEN  AKI Lab Results  Component Value Date   CREATININE 4.75 (H) 11/13/2021   BUN 84 (H) 11/13/2021   NA 136 11/13/2021   K  3.8 11/13/2021   CL 98 11/13/2021   CO2 24 11/13/2021    Intake/Output Summary (Last 24 hours) at 11/13/2021 0933 Last data filed at 11/13/2021 0900 Gross per 24 hour  Intake 3688.72 ml  Output 15 ml  Net 3673.72 ml    Net IO Since Admission: 6,212.92 mL [11/13/21 0933] -Fluid replacement yesterday given altered BP, pressors advancing  -follow labs as needed -replace as needed -pharmacy consultation and following -Decision making  CRRT: Volume overload: unchanged hypoxic profile pre-dating AKI, no obvious benefit to vol reduce Hyperkalemia: has not been an issue Acidosis: there has not been significant AG acidosis, bicarb preserved Uremia:  BUN remains < 100 Safety:   3 high dose pressors, circuit may cause hemodynamic instability   ACUTE ANEMIA- TRANSFUSE AS NEEDED CONSIDER TRANSFUSION  IF  HGB<7 DVT PRX with TED/SCD's ONLY Re-assess coags and synthetic function with several bleeding sites  Best practice (right click and "Reselect all SmartList Selections" daily)  Diet: NPO Pain/Anxiety/Delirium protocol (if indicated): Yes (RASS goal -2) VAP protocol (if indicated): Yes DVT prophylaxis: LMWH GI prophylaxis: PPI Glucose control:  SSI No Central venous access:  Yes, and it is still needed Arterial line:  N/A Foley:  Yes, and it is still needed Mobility:  bed rest  Code Status:  FULL Disposition:ICU  Labs   CBC: Recent Labs  Lab 11/24/2021 1122 11/10/21 0432 11/11/21 0405 11/12/21 0403 11/13/21 0354  WBC 5.2 9.5 7.9 32.3* 35.0*  NEUTROABS 4.1  --   --  27.1* 26.8*  HGB 12.9* 11.5* 10.1* 10.4* 10.4*  HCT 38.3* 35.3* 32.2* 34.9* 34.7*  MCV 85.7 90.1 92.3 98.0 98.3  PLT 96* 99* 74* 126* 175     Basic Metabolic Panel: Recent Labs  Lab 11/29/2021 1340 10/31/2021 1600 11/06/2021 1840 11/10/21 0432 11/10/21 1027 11/10/21 1446 11/10/21 2009 11/11/21 0405 11/11/21 0944 11/11/21 1957 11/12/21 0028 11/12/21 0403 11/12/21 0916 11/13/21 0354  NA 118* 121*   < > 124*   < > 130*   < > 134*   < > 135 137 135 135 136  K 2.6*  --    < > 3.4*  --  3.5  --  3.7  --   --   --  3.9  --  3.8  CL 91*  --   --  95*  --   --   --  104  --   --   --  105  --  98  CO2 19*  --   --  22  --   --   --  26  --   --   --  23  --  24  GLUCOSE 107*  --   --  148*  --   --   --  113*  --   --   --  103*  --  135*  BUN 38*  --   --  34*  --   --   --  27*  --   --   --  50*  --  84*  CREATININE 0.95  --   --  0.89  --   --   --  0.70  --   --   --  1.96*  --  4.75*  CALCIUM 6.8*  --   --  7.3*  --   --   --  7.9*  --   --   --  7.7*  --  7.3*  MG  --  1.7  --  2.6*  --   --   --  3.1*  --   --   --  3.1*  --  3.3*  PHOS  --  1.2*  --  3.5  --   --   --  1.9*  --   --   --  6.6*  --  10.3*   < > = values in this interval not displayed.    GFR: Estimated Creatinine Clearance: 24.9  mL/min (A) (by C-G formula based on SCr of 4.75 mg/dL (H)). Recent Labs  Lab 2021/12/01 1121 December 01, 2021 1122 01-Dec-2021 1340 01-Dec-2021 1410 11/10/21 0432 11/11/21 0405 11/12/21 0403 11/13/21 0354  PROCALCITON  --   --  3.83  --   --   --   --   --   WBC  --    < >  --   --  9.5 7.9 32.3* 35.0*  LATICACIDVEN 2.7*  --   --  1.7  --   --   --   --    < > = values in this interval not displayed.     Liver Function Tests: Recent Labs  Lab 12-01-2021 1122 11/10/21 0432 11/11/21 0405 11/12/21 1550  AST 58* 52* 65* 153*  ALT 39  ALKPHOS 22* 26* 42 79  BILITOT 3.8* 2.1* 1.5* 2.8*  PROT 8.7* 7.6 7.0 6.9  ALBUMIN 3.0* 2.3* 2.6* 2.5*    Recent Labs  Lab 12/01/2021 1533  LIPASE 30    No results for input(s): AMMONIA in the last 168 hours.  ABG    Component Value Date/Time   PHART 7.11 (LL) 11/13/2021 0857   PCO2ART 65 (H) 11/13/2021 0857   PO2ART 76 (L) 11/13/2021 0857   HCO3 20.6 11/13/2021 0857   ACIDBASEDEF 9.2 (H) 11/13/2021 0857   O2SAT 89.0 11/13/2021 0857      Coagulation Profile: Recent Labs  Lab December 01, 2021 1122  INR 1.4*     Cardiac Enzymes: No results for input(s): CKTOTAL, CKMB, CKMBINDEX, TROPONINI in the last 168 hours.  HbA1C: No results found for: HGBA1C  CBG: Recent Labs  Lab 2021-12-01 1716  GLUCAP 130*      DVT/GI PRX  assessed I Assessed the need for Labs I Assessed the need for Foley I Assessed the need for Central Venous Line Family Discussion when available I Assessed the need for Mobilization I made an Assessment of medications to be adjusted accordingly Safety Risk assessment completed  CASE DISCUSSED IN MULTIDISCIPLINARY ROUNDS WITH ICU TEAM   Critical care was necessary to treat /prevent imminent and life-threatening deterioration.   PATIENT WITH VERY POOR PROGNOSIS I ANTICIPATE PROLONGED ICU LOS  Patient is critically ill. Patient with Multiorgan failure and at high risk for cardiac arrest and death.   Critical  Care Time devoted to patient care services described in this note is 90 minutes.  Overall, patient is critically ill, prognosis is guarded.   Patient with Multiorgan failure and at high risk for cardiac arrest and death.  Images reviewed directly and interpretation in A&P is my own unless noted Labs reviewed and evaluated as noted in A&P

## 2021-11-13 NOTE — Progress Notes (Signed)
Nutrition Follow Up Note   DOCUMENTATION CODES:   Obesity unspecified  INTERVENTION:   Vital HP '@10ml' /hr + ProSource TF 62m QID via tube   Propofol: 11.842mhr- provides 313kcal/day   Free water flushes 3049m4 hours to maintain tube patency   Regimen provides 873kcal/day, 109g/day protein and 380m50my free water   MVI and folic acid daily via tube   Pt at high refeed risk; recommend monitor potassium, magnesium and phosphorus labs daily until stable  NUTRITION DIAGNOSIS:   Inadequate oral intake related to inability to eat (pt sedated and ventilated) as evidenced by NPO status.  GOAL:   Provide needs based on ASPEN/SCCM guidelines -not met   MONITOR:   Labs, Vent status, Weight trends, TF tolerance, Skin, I & O's  ASSESSMENT:   35 y69 male with h/o etoh abuse who is admitted with cirrhosis, ascites and CAP.  Pt remains sedated and ventilated. OGT in place. Pt with worsening renal function and on multiple pressors. Tube feeds decreased to trickle rate per MD. Per chart, pt up ~10lbs since admission; pt +6.9L on his I & Os. Pt with poor prognosis. RD will monitor for GOC.   Medications reviewed and include: colace, folic acid, solu-cortef, MVI, protonix, miralax, thiamine, meropenem, levophed, phenylephrine, propofol, vasopressin   Labs reviewed: K 3.8 wnl, BUN 84(H), creat 4.75(H), P 10.3(H), Mg 3.3(H) Wbc- 35.0(H), Hgb 10.4(L), Hct 34.7(L)  Patient is currently intubated on ventilator support MV: 6.4 L/min Temp (24hrs), Avg:101.4 F (38.6 C), Min:100 F (37.8 C), Max:102.2 F (39 C)  Propofol: 11.87 ml/hr- provides 313kcal/day   MAP- >65mm14m UOP- 5ml/d58m  Diet Order:   Diet Order             Diet NPO time specified  Diet effective now                  EDUCATION NEEDS:   No education needs have been identified at this time  Skin:  Skin Assessment: Reviewed RN Assessment  Last BM:  12/14- type 7  Height:   Ht Readings from Last 1  Encounters:  11/11/21 5' 5.98" (1.676 m)    Weight:   Wt Readings from Last 1 Encounters:  11/13/21 103.6 kg    Ideal Body Weight:  64.5 kg  BMI:  Body mass index is 36.88 kg/m.  Estimated Nutritional Needs:   Kcal:  1200-1519kcal/day  Protein:  >129g/day  Fluid:  1.9-2.2L/day  Yordan Martindale Koleen DistanceD, LDN Please refer to [facility] and/or RD on-call/weekend/after hours pager

## 2021-11-13 NOTE — Progress Notes (Addendum)
Priest to bedside for prayer with family. Wife updated that interpreter will come to bedside around 10:00 am.

## 2021-11-13 NOTE — Progress Notes (Signed)
Pt unstable overnight, added neo and maxed on vaso and levo. Green stool, when turned pt desaturates and becomes hypotensive. Fever now 102.2. Unable to give tylenol or motrin secondary to liver and renal failure. Ice packs applied to groin and armpits. Family at bedside, updated.

## 2021-11-13 NOTE — Progress Notes (Signed)
Temp is currently 104.2, notified NP Elvina Sidle, awaiting orders. Cooling blanket in place.

## 2021-11-13 NOTE — Patient Care Conference (Signed)
PCCM  Case discussed with Wife, Mother, Father, Brothers and extended family Explained we are in MSOF: lung, kidney, liver at the top of the list  The patient is rapidly in decline, please review the progress note of today. We will not proceed with CRRT at this time (see today's note) Not a candidate for liver transplant, and I explained that was not an option    Plan to continue aggressive care\ Will not provide CPR in event of cardiac event (PARTIAL CODE BLUE)  //Jenean Escandon

## 2021-11-13 NOTE — Consult Note (Signed)
PHARMACY CONSULT NOTE  Pharmacy Consult for Electrolyte Monitoring and Replacement   Recent Labs: Potassium (mmol/L)  Date Value  11/13/2021 3.8   Magnesium (mg/dL)  Date Value  13/06/6577 3.3 (H)   Calcium (mg/dL)  Date Value  46/96/2952 7.3 (L)   Albumin (g/dL)  Date Value  84/13/2440 2.5 (L)   Phosphorus (mg/dL)  Date Value  09/26/2535 10.3 (H)   Sodium (mmol/L)  Date Value  11/13/2021 136    Assessment: Patient is a 35 y/o M with history of alcohol use disorder who presented to the ED 12/11 with pleuritic chest pain. CTA interpretation with bilateral pulmonary masses and associated left effusion concerning for malignancy with infectious process or granulomatous disease felt to be less likely. Appears patient was intubated in the ED. Pharmacy consulted to assist with electrolyte monitoring and replacement as indicated.  Scr 0.95>0.89>0.70>1.96>4.75  Labs  Na 135>136 K 3.4>3.5>3.7>3.9>3.8 Phos 3.5>1.9>6.6>10.3 Mg 2.6>3.1>3.1>3.3  Goal of Therapy:  Electrolytes within normal limits  Plan:  --No replacement is indicated at this time --Will continue to monitor electrolytes and f/u with AM labs  Doloris Hall, PharmD Pharmacy Resident  11/13/2021 8:02 AM

## 2021-11-13 NOTE — Progress Notes (Signed)
Currently have Vasopressin and Phenylephrine maxed out for patient's blood pressure, have increased Levophed to . Blood pressure is not responding well to medication. Have notified Elvina Sidle NP

## 2021-11-13 NOTE — Progress Notes (Signed)
Date of Admission:  11/15/2021   : Adam White is a 35 y.o. male Principal Problem:   Community acquired pneumonia Active Problems:   Acute respiratory failure with hypoxia (HCC)    Subjective: Remains critically ill, intubated Family at bedside. Worsening fever and leukocytosis teven after escalation of antiiotics  Medications:   chlorhexidine gluconate (MEDLINE KIT)  15 mL Mouth Rinse BID   Chlorhexidine Gluconate Cloth  6 each Topical Daily   diazepam  5 mg Intravenous Q6H   docusate  100 mg Per Tube BID   feeding supplement (PROSource TF)  90 mL Per Tube QID   feeding supplement (VITAL HIGH PROTEIN)  1,000 mL Per Tube Y85O   folic acid  1 mg Per Tube Daily   free water  30 mL Per Tube Q4H   hydrocortisone sod succinate (SOLU-CORTEF) inj  100 mg Intravenous Q8H   mouth rinse  15 mL Mouth Rinse 10 times per day   multivitamin with minerals  1 tablet Per Tube Daily   pantoprazole (PROTONIX) IV  40 mg Intravenous Q12H   polyethylene glycol  17 g Per Tube Daily   thiamine injection  100 mg Intravenous Q24H    Objective: Vital signs in last 24 hours: Temp:  [100 F (37.8 C)-102.2 F (39 C)] 102.2 F (39 C) (12/15 0615) Pulse Rate:  [112-125] 125 (12/15 1100) Resp:  [20-33] 26 (12/15 0930) SpO2:  [87 %-98 %] 93 % (12/15 1131) Arterial Line BP: (87-109)/(43-57) 99/57 (12/15 1100) FiO2 (%):  [100 %] 100 % (12/15 1131) Weight:  [103.6 kg] 103.6 kg (12/15 0402)  PHYSICAL EXAM:  General: Intubated and sedated.  On pressor Lungs: Bilateral air entry.  Crepitations both lungs.. Heart: Tachycardia.   Abdomen: Soft, distended. Bowel sounds normal. No masses Extremities: Edema legs  skin: No rashes or lesions. Or bruising Lymph: Cervical, supraclavicular normal. Neurologic: Cannot be assessed  Lab Results Recent Labs    11/12/21 0403 11/12/21 0916 11/13/21 0354  WBC 32.3*  --  35.0*  HGB 10.4*  --  10.4*  HCT 34.9*  --  34.7*  NA 135 135 136  K 3.9   --  3.8  CL 105  --  98  CO2 23  --  24  BUN 50*  --  84*  CREATININE 1.96*  --  4.75*   Liver Panel Recent Labs    11/11/21 0405 11/12/21 1550  PROT 7.0 6.9  ALBUMIN 2.6* 2.5*  AST 65* 153*  ALT 23 39  ALKPHOS 42 79  BILITOT 1.5* 2.8*  BILIDIR 0.9* 1.6*  IBILI 0.6 1.2*    Microbiology: 12,10 blood culture strep pneumo 12/11 blood culture strep pneumo 12/11 blood cultures in the evening no growth so far 12/14 blood cultures have been repeated Studies/Results: Ultrasound liver shows portal vein thrombosis of the liver hilum with Doppler ultrasound.  Hepatomegaly with nodular liver contour suspicious for cirrhosis. CT abdomen shows bilateral lower lobe consolidation with air bronchograms concerning for bilateral pneumonia.  Assessment/Plan: Severe sepsis with Streptococcus pneumonia bacteremia and bilateral pneumonia which is community-acquired  Acute hypoxic respiratory failure intubated.  Portal vein thrombosis. Noted liver cirrhosis Ascites  Worsening leukocytosis and fever today Differential diagnosis includes worsening thrombosis of the portal vein system leading to mesenteric ischemia versus liver ischemia Abscess formation in the lung versus empyema Patient's antibiotic has been escalated by the ICU team.  Initially was on ceftriaxone then switched over to Zosyn yesterday and today is on meropenem and linezolid.  recommend getting a CT chest as well as MRA or CTA of the abdomen when stable. °No response to WBC or fever- so will revert back to ceftriaxone  + flagyl ° °Respiratory acidosis with CO2 retention. ° °Patient had epistaxis when he was started on IV heparin °PT from 1211 2217.3 and PTT is 39.   °Platelet is improved to 126. °Less likely to be DIC. ° °Poor prognosis ° °Discussed the management with the care team. °

## 2021-11-14 ENCOUNTER — Inpatient Hospital Stay: Payer: Medicaid Other

## 2021-11-14 LAB — BLOOD GAS, ARTERIAL
Acid-base deficit: 21.3 mmol/L — ABNORMAL HIGH (ref 0.0–2.0)
Bicarbonate: 9.7 mmol/L — ABNORMAL LOW (ref 20.0–28.0)
FIO2: 1
O2 Saturation: 97.5 %
PEEP: 7 cmH2O
Patient temperature: 37
pCO2 arterial: 43 mmHg (ref 32.0–48.0)
pH, Arterial: 6.96 — CL (ref 7.350–7.450)
pO2, Arterial: 144 mmHg — ABNORMAL HIGH (ref 83.0–108.0)

## 2021-11-14 LAB — COMPREHENSIVE METABOLIC PANEL
ALT: 263 U/L — ABNORMAL HIGH (ref 0–44)
AST: 2765 U/L — ABNORMAL HIGH (ref 15–41)
Albumin: 2.1 g/dL — ABNORMAL LOW (ref 3.5–5.0)
Alkaline Phosphatase: 129 U/L — ABNORMAL HIGH (ref 38–126)
Anion gap: 23 — ABNORMAL HIGH (ref 5–15)
BUN: 112 mg/dL — ABNORMAL HIGH (ref 6–20)
CO2: 12 mmol/L — ABNORMAL LOW (ref 22–32)
Calcium: 6.2 mg/dL — CL (ref 8.9–10.3)
Chloride: 102 mmol/L (ref 98–111)
Creatinine, Ser: 8.04 mg/dL — ABNORMAL HIGH (ref 0.61–1.24)
GFR, Estimated: 8 mL/min — ABNORMAL LOW (ref >60)
Glucose, Bld: 31 mg/dL — CL (ref 70–99)
Potassium: 6.8 mmol/L (ref 3.5–5.1)
Sodium: 137 mmol/L (ref 135–145)
Total Bilirubin: 2.4 mg/dL — ABNORMAL HIGH (ref 0.3–1.2)
Total Protein: 6.6 g/dL (ref 6.5–8.1)

## 2021-11-14 LAB — CBC
HCT: 29.7 % — ABNORMAL LOW (ref 39.0–52.0)
Hemoglobin: 8.9 g/dL — ABNORMAL LOW (ref 13.0–17.0)
MCH: 29.4 pg (ref 26.0–34.0)
MCHC: 30 g/dL (ref 30.0–36.0)
MCV: 98 fL (ref 80.0–100.0)
Platelets: 162 10*3/uL (ref 150–400)
RBC: 3.03 MIL/uL — ABNORMAL LOW (ref 4.22–5.81)
RDW: 18.6 % — ABNORMAL HIGH (ref 11.5–15.5)
WBC: 33.1 10*3/uL — ABNORMAL HIGH (ref 4.0–10.5)
nRBC: 3 % — ABNORMAL HIGH (ref 0.0–0.2)

## 2021-11-14 LAB — FIBRINOGEN: Fibrinogen: 675 mg/dL — ABNORMAL HIGH (ref 210–475)

## 2021-11-14 LAB — PATHOLOGIST SMEAR REVIEW

## 2021-11-14 LAB — HEMOGLOBIN AND HEMATOCRIT, BLOOD
HCT: 32.1 % — ABNORMAL LOW (ref 39.0–52.0)
Hemoglobin: 9.5 g/dL — ABNORMAL LOW (ref 13.0–17.0)

## 2021-11-14 LAB — D-DIMER, QUANTITATIVE: D-Dimer, Quant: >20 ug/mL-FEU — ABNORMAL HIGH (ref 0.00–0.50)

## 2021-11-14 LAB — PHOSPHORUS: Phosphorus: 15.7 mg/dL — ABNORMAL HIGH (ref 2.5–4.6)

## 2021-11-14 LAB — PROTIME-INR
INR: 2.3 — ABNORMAL HIGH (ref 0.8–1.2)
Prothrombin Time: 25.1 seconds — ABNORMAL HIGH (ref 11.4–15.2)

## 2021-11-14 LAB — LACTIC ACID, PLASMA
Lactic Acid, Venous: >9 mmol/L (ref 0.5–1.9)
Lactic Acid, Venous: >9 mmol/L (ref 0.5–1.9)

## 2021-11-14 LAB — APTT: aPTT: 50 seconds — ABNORMAL HIGH (ref 24–36)

## 2021-11-14 LAB — MAGNESIUM: Magnesium: 3.4 mg/dL — ABNORMAL HIGH (ref 1.7–2.4)

## 2021-11-14 MED ORDER — SODIUM BICARBONATE 8.4 % IV SOLN: 100.0000 meq | Freq: Once | INTRAVENOUS | Status: AC

## 2021-11-14 MED ORDER — DEXTROSE 50 % IV SOLN
INTRAVENOUS | Status: AC
  Administered 2021-11-14: 50 mL via INTRAVENOUS
  Filled 2021-11-14: qty 50

## 2021-11-14 MED ORDER — SODIUM BICARBONATE 8.4 % IV SOLN
INTRAVENOUS | Status: AC
  Administered 2021-11-14: 100 meq via INTRAVENOUS
  Filled 2021-11-14: qty 100

## 2021-11-14 MED ORDER — STERILE WATER FOR INJECTION IV SOLN
INTRAVENOUS | Status: DC
  Filled 2021-11-14 (×3): qty 1000
  Filled 2021-11-14: qty 150

## 2021-11-14 MED ORDER — SODIUM BICARBONATE 8.4 % IV SOLN
INTRAVENOUS | Status: AC
  Administered 2021-11-14: 50 meq
  Filled 2021-11-14: qty 50

## 2021-11-14 MED ORDER — EPINEPHRINE 1 MG/10ML IJ SOSY
PREFILLED_SYRINGE | INTRAMUSCULAR | Status: AC
  Filled 2021-11-14: qty 10

## 2021-11-14 MED ORDER — MIDODRINE HCL 5 MG PO TABS: 10.0000 mg | ORAL_TABLET | Freq: Three times a day (TID) | ORAL | Status: DC

## 2021-11-14 MED ORDER — DEXTROSE 50 % IV SOLN: 1.0000 | Freq: Once | INTRAVENOUS | Status: AC

## 2021-11-14 MED ORDER — SODIUM CHLORIDE 0.9 % IV SOLN
2.0000 g | INTRAVENOUS | Status: DC
  Administered 2021-11-14: 2 g via INTRAVENOUS
  Filled 2021-11-14: qty 2

## 2021-11-14 MED ORDER — METRONIDAZOLE 500 MG/100ML IV SOLN
500.0000 mg | Freq: Two times a day (BID) | INTRAVENOUS | Status: DC
Start: 2021-11-14 — End: 2021-11-14
  Administered 2021-11-14: 500 mg via INTRAVENOUS
  Filled 2021-11-14 (×2): qty 100

## 2021-11-14 MED ORDER — SODIUM BICARBONATE 8.4 % IV SOLN: 50.0000 meq | Freq: Once | INTRAVENOUS | Status: DC

## 2021-11-17 LAB — CULTURE, BLOOD (ROUTINE X 2)
Culture: NO GROWTH
Culture: NO GROWTH
Special Requests: ADEQUATE
Special Requests: ADEQUATE

## 2021-11-19 LAB — CULTURE, BLOOD (ROUTINE X 2)
Culture: NO GROWTH
Culture: NO GROWTH
Special Requests: ADEQUATE
Special Requests: ADEQUATE

## 2021-11-25 LAB — BLOOD GAS, VENOUS
Acid-base deficit: 0.8 mmol/L (ref 0.0–2.0)
Bicarbonate: 21.6 mmol/L (ref 20.0–28.0)
O2 Saturation: 83.5 %
Patient temperature: 37
pCO2, Ven: 29 mmHg — ABNORMAL LOW (ref 44.0–60.0)
pH, Ven: 7.48 — ABNORMAL HIGH (ref 7.250–7.430)
pO2, Ven: 44 mmHg (ref 32.0–45.0)

## 2021-11-30 NOTE — Progress Notes (Addendum)
BRIEF PCCM NOTE  Pt with persistent fever (T max 104), and is becoming more hypotensive (SBP 60's) despite max dose of 3 VASOPRESSORS, stress dose steroids, adequate volume status (CVP 27), and Hemoglobin 9.5.  Will check lactic acid and ABG for metabolic acidosis that could possibly require bicarb administration ~ Found to have severe metabolic acidosis on ABG (pH 6.96 / pCO2 43/ pO2 144/ Bicarb 9.7) ~ will give 2 amps of Bicarb and start on Bicarb gtt.  Concern that pt could have worsening of thrombosis of the portal vein system with possible mesenteric ischemia vs. Liver ischemia.  Unfortunately pt is TOO UNSTABLE to send for CTA Chest & Abdomen/Pelvis.   Pt is critically ill with multiorgan failure.  High risk/likelihood of cardiac arrest and death.  Updated pt's brother at bedside of pt's decline and high risk for cardiac arrest.  He understands that pt is worsening with high likelihood that he may not survive. He asks that we continue to do everything we can for his brother.   He has relayed status to the rest of the family who plan to come to bedside.  Per previous family discussions with Dr. Earlie Server, pt is a limited code (NO CPR).        Critical Care Time: 30 minutes  Harlon Ditty, AGACNP-BC  Pulmonary & Critical Care Prefer epic messenger for cross cover needs If after hours, please call E-link

## 2021-11-30 NOTE — Death Summary Note (Signed)
DEATH SUMMARY   Patient Details  Name: Adam White MRN: [id number] DOB: 1986/03/31  Admission/Discharge Information   Admit Date:  2021-11-18  Date of Death: Date of Death: 2021-11-23  Time of Death: Time of Death: 0850  Length of Stay: 5  Referring Physician: Pcp, No   Reason(s) for Hospitalization  LIVER FAILURE  Diagnoses  Preliminary cause of death: LIVER CIRRHOSIS, LIVER FAILURE, STEP PNEUMONIA Secondary Diagnoses (including complications and co-morbidities):  Principal Problem:   Community acquired pneumonia Active Problems:   Acute respiratory failure with hypoxia [facility])   Brief Hospital Course (including significant findings, care, treatment, and services provided and events leading to death)  BRIEF SYNOPSIS 36 y.o with significant PMH of EtOH abuse and  thrombocytopenia who presented to the ED with chief complaints of progressive shortness of breath. +ETOH ABUSE, +liver cirrhosis   CT chest with severe b/l opacities c/w pneumonia Progressive resp failure and intubated       Significant Hospital Events: Including procedures, antibiotic start and stop dates in addition to other pertinent events   2023/11/19 severe resp failure 12/12 severe resp failure, remains on  vent       Significant Diagnostic Tests:  Nov 19, 2023: Chest Xray>Rounded density is noted in right lower lobe concerning for mass or nodule. Nodular opacities are seen in the left midlung. Some degree of left pleural effusion is noted as well. CT scan of the chest is recommended for further evaluation. November 19, 2023: CTA chest>Bilateral pulmonary pulmonary masses and confluent nodularity. Dense near complete consolidation of the LEFT lower lobe with associated LEFT effusion. Findings are concerning for pulmonary malignancy. Consider LYMPHOMA. Granulomatous disease or infectious process is much less favored. 2023/11/19: CTA abdomen and pelvis>   Micro Data:  11/19/2023: SARS-CoV-2 PCR> negative 11/19/2023: Influenza  PCR> negative 2023/11/19: Blood culture x2> 11-19-2023: Urine Culture> November 19, 2023: MRSA PCR>>  11-19-23: Strep pneumo urinary antigen> November 19, 2023: Legionella urinary antigen> 2023/11/19: Mycoplasma pneumonia>   Antimicrobials:  Vancomycin 2023-11-19> Azithromycin November 19, 2023> Ceftriaxone November 19, 2023    36 yo Hispanic male with severe hypoxic respiratory failure with severe pneumonia with ETOH abuse and liver cirrhosis   Severe ACUTE Hypoxic and Hypercapnic Respiratory Failure  >  Vent Mode: Bi-Vent FiO2 (%):  [100 %] 100 % Set Rate:  [20 bmp] 20 bmp PEEP:  [7 cmH20-8 cmH20] 7 cmH20     Trial of LEFT lung UP, heavy wedge to the right There is bound to be severe shunt physiology in the left base Prone apparently precluded by belly habitus? Meanwhile will trial in APRV to recruit lung Tolerate permissive hypercapnia for pH>/=7.25, bicarb as needed The patient being young has a strong chest wall and was actually pulling below baseline until pLO set 7-8 Will maintain this unconventional setting to prevent de-recruitment  while at pLO Fentanyl not maintaining comfort, will move to Dilaudid Change to Merrem and Zyvox for polymicrobial cover given history of possible aspiration/EtOH use Attempt high parenchymal concentration of antibiosis while respecting renal limits Prognosis will be poor if the left base does not recruit.    SEVERE ALCOHOL ABUSE  -Therapy with Thiamine and MVI -Replace CIWA with standing Valium  -High risk for death   CARDIAC Remains tachycardic ICU monitoring New pressor requirement today, Levo over 30 this morning  Re-Cx today   NEUROLOGY Acute toxic metabolic encephalopathy, need for sedation Goal RASS -3 for now, mitigate tachypnea   INFECTIOUS DISEASE Recent Labs       Lab Results  Component Value Date    WBC 32.3 (H)  11/12/2021      -continue antibiotics as prescribed, see above -follow up cultures -R/o TB but doubt active TB, would appear more of a lobar infiltrate from more  typical bacteria -Re-culture today for completeness    ENDO - ICU hypoglycemic\Hyperglycemia protocol - check FSBS per protocol   GI-LIVER CIRRHOSIS Thrombosis of portal vein Intolerance to anticoagulation with severe unrelenting epistaxis  Likely extension of thrombus with faltering LFTs, fever, AKI GI PROPHYLAXIS as indicated   NUTRITIONAL STATUS DIET-->TF's as tolerated Constipation protocol as indicated   RENAL/FEN   AKI Recent Labs       Lab Results  Component Value Date    CREATININE 1.96 (H) 11/12/2021    BUN 50 (H) 11/12/2021    NA 135 11/12/2021    K 3.9 11/12/2021    CL 105 11/12/2021    CO2 23 11/12/2021        Intake/Output Summary (Last 24 hours) at 11/12/2021 1412 Last data filed at 11/12/2021 1400    Gross per 24 hour  Intake 3749.11 ml  Output 860 ml  Net 2889.11 ml      Net IO Since Admission: 3,477.63 mL [11/12/21 1412] -Fluid replace,ment today given altered BP -follow labs as needed -replace as needed -pharmacy consultation and following   ACUTE ANEMIA- TRANSFUSE AS NEEDED CONSIDER TRANSFUSION  IF HGB<7 DVT PRX with TED/SCD's ONLY     Best practice (right click and "Reselect all SmartList Selections" daily)  Diet: NPO Pain/Anxiety/Delirium protocol (if indicated): Yes (RASS goal -2) VAP protocol (if indicated): Yes DVT prophylaxis: LMWH GI prophylaxis: PPI Glucose control:  SSI No Central venous access:  Yes, and it is still needed Arterial line:  N/A Foley:  Yes, and it is still needed Mobility:  bed rest  Code Status:  FULL Disposition:ICU  Case discussed with Wife, Mother, Father, Brothers and extended family Explained we are in MSOF: lung, kidney, liver at the top of the list  The patient is rapidly in decline, please review the progress note of today. We will not proceed with CRRT at this time (see today's note) Not a candidate for liver transplant, and I explained that was not an option     Plan to continue aggressive  care\ Will not provide CPR in event of cardiac event (PARTIAL CODE BLUE)    Pt with persistent fever (T max 104), and is becoming more hypotensive (SBP 60's) despite max dose of 3 VASOPRESSORS, stress dose steroids, adequate volume status (CVP 27), and Hemoglobin 9.5.   Will check lactic acid and ABG for metabolic acidosis that could possibly require bicarb administration ~ Found to have severe metabolic acidosis on ABG (pH 6.96 / pCO2 43/ pO2 144/ Bicarb 9.7) ~ will give 2 amps of Bicarb and start on Bicarb gtt.   Concern that pt could have worsening of thrombosis of the portal vein system with possible mesenteric ischemia vs. Liver ischemia.  Unfortunately pt is TOO UNSTABLE to send for CTA Chest & Abdomen/Pelvis.     Pt is critically ill with multiorgan failure.  High risk/likelihood of cardiac arrest and death.   Updated pt's brother at bedside of pt's decline and high risk for cardiac arrest.  He understands that pt is worsening with high likelihood that he may not survive. He asks that we continue to do everything we can for his brother.   He has relayed status to the rest of the family who plan to come to bedside.   Per previous family discussions with Dr.  Merrilee Jansky, pt is a limited code (NO CPR).     Pt's wife, 2 brothers, mother, and Chaplain at bedside.  We discussed the pt's declining status despite maximum medical therapy.  Concern bowel ischemia vs liver ischemia.  Pt is in the dying process.     Followed up on CODE STATUS (Limited Code with NO CPR) from previous discussion with Dr. Merrilee Jansky yesterday.  Given that pt continues to decline with multiorgan failure, there would be little benefit to resuscitation, along with the likelihood that we would we be placing undue suffering to the patient.     Pt's family do not want to subject undue suffering, and are in agreement with changing CODE STATUS to FULL DNR. Patient expired; time of death 87. Extensive family at bedside, including  wife. Lines and tubes removed, body prepared for morgue.      Pertinent Labs and Studies  Significant Diagnostic Studies CT ABDOMEN PELVIS WO CONTRAST  Result Date: 11/23/2021 CLINICAL DATA:  36 year old male with pleuritic chest pain productive cough. Shortness of breath. EXAM: CT ABDOMEN AND PELVIS WITHOUT CONTRAST TECHNIQUE: Multidetector CT imaging of the abdomen and pelvis was performed following the standard protocol without IV contrast. COMPARISON:  Chest radiograph performed earlier on the same date FINDINGS: Lower chest: There are bilateral lower lobe consolidations with air bronchograms concerning for bilateral pneumonia. Small bilateral pleural effusions. Hepatobiliary: The liver is enlarged with nodular contour concerning for cirrhosis. Clinical correlation is suggested. Pancreas: Unremarkable. No pancreatic ductal dilatation or surrounding inflammatory changes. Spleen: Normal in size without focal abnormality. Adrenals/Urinary Tract: Adrenal glands are unremarkable. Kidneys are normal, without renal calculi, focal lesion, or hydronephrosis. Bladder is unremarkable. Stomach/Bowel: Stomach is within normal limits. Appendix appears normal. No evidence of bowel wall thickening, distention, or inflammatory changes. Vascular/Lymphatic: No significant vascular findings are present. No enlarged abdominal or pelvic lymph nodes. Reproductive: Prostate is unremarkable. Other: No abdominal wall hernia or abnormality. No abdominopelvic ascites. Musculoskeletal: No acute or significant osseous findings. IMPRESSION: 1. Bilateral lower lobe consolidation with air bronchograms concerning for bilateral pneumonia. Follow-up examination to resolution is recommended. 2. Enlarged liver with nodular contour concerning for cirrhosis. Clinical correlation is suggested. 3.  No CT evidence of acute abdominal/pelvic process. Electronically Signed   By: Keane Police D.O.   On: 11/23/2021 17:03   DG Chest 1  View  Result Date: 11/25/2021 CLINICAL DATA:  Central line placement, intubated EXAM: CHEST  1 VIEW COMPARISON:  11/11/2021 FINDINGS: Single frontal view of the chest demonstrates endotracheal tube overlying tracheal air column approximally 2.8 cm above carina. Enteric catheter passes below diaphragm tip excluded by collimation. Left subclavian central venous catheter is identified, extending cephalad over the expected course of the left internal jugular vein, tip excluded by collimation. Recommend removal and replacement. Bilateral airspace disease, left greater than right, unchanged. Stable left pleural effusion. No pneumothorax. IMPRESSION: 1. Malpositioning of a left subclavian catheter, extending cephalad in the region of the internal jugular vein, tip excluded by collimation. Recommend removal and replacement. 2. Endotracheal tube and enteric catheter as above. 3. Bilateral airspace disease, left greater than right, with stable left pleural effusion. Critical Value/emergent results were called by telephone at the time of interpretation on 10/30/2021 at 11:22 pm to the patient's nurse, Elzie Rings, who verbally acknowledged these results. The physician was unavailable, and the nurse will pass along the report to the physician. Electronically Signed   By: Randa Ngo M.D.   On: 10/31/2021 23:23   CT Angio Chest PE W  and/or Wo Contrast  Result Date: 11/07/2021 CLINICAL DATA:  Pulmonary embolism suspected.  Extreme tachycardia. EXAM: CT ANGIOGRAPHY CHEST WITH CONTRAST TECHNIQUE: Multidetector CT imaging of the chest was performed using the standard protocol during bolus administration of intravenous contrast. Multiplanar CT image reconstructions and MIPs were obtained to evaluate the vascular anatomy. CONTRAST:  19m OMNIPAQUE IOHEXOL 350 MG/ML SOLN COMPARISON:  None. FINDINGS: Cardiovascular: Exam is degraded respiratory and cardiac motion. Convincing evidence of acute pulmonary embolism. No pericardial  fluid.  Great vessels normal Mediastinum/Nodes: No mediastinal adenopathy. No supraclavicular or axillary adenopathy. Lungs/Pleura: Rounded pulmonary parenchymal nodules and masses within the LEFT and RIGHT lung involving the upper lobes and lower lobes. Consolidative mass in the perihilar LEFT upper lobe measures 4.0 x 6.0 cm. Oblong mass in the RIGHT lobe measures 4.0 x 2.9 cm. Patchy nodularity a perihilar pattern in the RIGHT upper lobe (image 31/6. There is dense consolidation in the LEFT lower lobe with associated moderate LEFT effusion. Upper Abdomen: Spleen is normal volume. No upper abdominal adenopathy. Musculoskeletal: No aggressive osseous lesion. Review of the MIP images confirms the above findings. IMPRESSION: 1. Exam significantly limited by patient respiratory motion and cardiac motion. No gross evidence of pulmonary embolism. 2. Bilateral pulmonary pulmonary masses and confluent nodularity. Dense near complete consolidation of the LEFT lower lobe with associated LEFT effusion. Findings are concerning for pulmonary malignancy. Consider LYMPHOMA. Granulomatous disease or infectious process is much less favored. 3. No supraclavicular adenopathy or axillary adenopathy. Electronically Signed   By: SSuzy BouchardM.D.   On: 11/23/2021 13:27   DG Chest Port 1 View  Result Date: 101-14-23CLINICAL DATA:  Acute respiratory failure with hypoxia EXAM: PORTABLE CHEST 1 VIEW COMPARISON:  Chest radiograph 1 day prior FINDINGS: The endotracheal tube tip is approximately 5.3 cm from the carina. The enteric catheter tip is off the field of view. The cardiomediastinal silhouette is grossly stable. There is significantly worsened confluent opacity in the right upper lobe. Patchy opacities throughout the left lung are not significantly changed. There are probable small bilateral pleural effusions. There is no definite pneumothorax. The bones are stable. IMPRESSION: Significantly worsened opacity in the right  upper lobe. Multifocal airspace disease throughout the left lung is similar to the prior study. Electronically Signed   By: PValetta MoleM.D.   On: 101/14/2308:27   DG Chest Port 1 View  Result Date: 11/13/2021 CLINICAL DATA:  Pneumonia. EXAM: PORTABLE CHEST 1 VIEW COMPARISON:  November 09, 2021. FINDINGS: Stable cardiomediastinal silhouette. Endotracheal and nasogastric tubes are unchanged in position. Stable left lung opacity is noted concerning for pneumonia. Increased right lung opacity is noted concerning for worsening pneumonia. Bony thorax is unremarkable. IMPRESSION: Stable support apparatus. Stable left lung opacity concerning for pneumonia. Increased right lung opacity is noted concerning for pneumonia. Electronically Signed   By: JMarijo ConceptionM.D.   On: 11/13/2021 11:08   DG Chest Portable 1 View  Result Date: 11/17/2021 CLINICAL DATA:  NG tube, intubation EXAM: PORTABLE CHEST 1 VIEW COMPARISON:  11/08/2021 FINDINGS: Endotracheal tube is 2 cm above the carina. NG tube is in the stomach. Bilateral airspace disease worsening since prior study, left greater than right. Findings concerning for pneumonia. Heart is borderline in size. IMPRESSION: Support devices as above. Worsening bilateral airspace disease, left greater than right concerning for pneumonia. Electronically Signed   By: KRolm BaptiseM.D.   On: 11/01/2021 16:00   DG Chest Portable 1 View  Result Date: 11/29/2021 CLINICAL DATA:  Shortness of breath. EXAM: PORTABLE CHEST 1 VIEW COMPARISON:  None. FINDINGS: The heart size and mediastinal contours are within normal limits. Rounded density is seen in right lower lobe concerning for mass or nodule. Nodular opacities are also seen in the left perihilar region. Left pleural effusion may be present as well. The visualized skeletal structures are unremarkable. IMPRESSION: Rounded density is noted in right lower lobe concerning for mass or nodule. Nodular opacities are seen in the left  midlung. Some degree of left pleural effusion is noted as well. CT scan of the chest is recommended for further evaluation. Electronically Signed   By: Marijo Conception M.D.   On: 11/04/2021 11:48   US Abdomen Limited RUQ (LIVER/GB)  Addendum Date: 11/10/2021   ADDENDUM REPORT: 11/10/2021 11:42 ADDENDUM: Study discussed by telephone with Dr. Mortimer Fries in the ICU on 11/10/2021 at 1138 hours. Electronically Signed   By: Genevie Ann M.D.   On: 11/10/2021 11:42   Result Date: 11/10/2021 CLINICAL DATA:  36 year old male with abnormal elevated LFTs. EXAM: ULTRASOUND ABDOMEN LIMITED RIGHT UPPER QUADRANT COMPARISON:  CT Abdomen and Pelvis 11/22/2021. FINDINGS: Gallbladder: Background gallbladder wall thickness remains within normal limits, but there are 2 foci of adherent tumefactive sludge (such as series 1, image 10). No shadowing echogenic stones. No pericholecystic fluid. Patient is intubated, no sonographic Murphy sign evaluated. Common bile duct: Diameter: 3 mm, normal. Liver: Hepatomegaly and mildly nodular liver contour (image 36). Background liver echogenicity within normal limits. No discrete liver lesion. Absent Doppler flow in the main portal vein (image 48). Other: No ascites.  Negative visible right kidney. IMPRESSION: 1. 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 2. Hepatomegaly with nodular liver contour suspicious for cirrhosis. No discrete liver lesion. 3. Gallbladder sludge but no evidence of acute cholecystitis or bile duct obstruction. Electronically Signed: By: Genevie Ann M.D. On: 11/10/2021 11:31    Microbiology Recent Results (from the past 240 hour(s))  Blood culture (routine x 2)     Status: Abnormal   Collection Time: 11/26/2021 11:22 AM   Specimen: BLOOD  Result Value Ref Range Status   Specimen Description   Final    BLOOD RIGHT ANTECUBITAL Performed at [facility], [address] [place], [place] [address]    Special Requests   Final    BOTTLES DRAWN  AEROBIC AND ANAEROBIC Blood Culture adequate volume Performed at [facility], [address] [place], [place] [address]    Culture  Setup Time   Final    GRAM POSITIVE COCCI Organism ID to follow IN BOTH AEROBIC AND ANAEROBIC BOTTLES CRITICAL RESULT CALLED TO, READ BACK BY AND VERIFIED WITH: JASON ROBINS AT 0051 11/10/21 GAA    Culture STREPTOCOCCUS PNEUMONIAE (A)  Final   Report Status 11/12/2021 FINAL  Final   Organism ID, Bacteria STREPTOCOCCUS PNEUMONIAE  Final      Susceptibility   Streptococcus pneumoniae - MIC*    ERYTHROMYCIN <=0.12 SENSITIVE Sensitive     LEVOFLOXACIN 0.5 SENSITIVE Sensitive     VANCOMYCIN 0.25 SENSITIVE Sensitive     PENICILLIN (meningitis) <=0.06 SENSITIVE Sensitive     PENO - penicillin <=0.06      PENICILLIN (non-meningitis) <=0.06 SENSITIVE Sensitive     PENICILLIN (oral) <=0.06 SENSITIVE Sensitive     CEFTRIAXONE (non-meningitis) <=0.12 SENSITIVE Sensitive     CEFTRIAXONE (meningitis) <=0.12 SENSITIVE Sensitive     * STREPTOCOCCUS PNEUMONIAE  Resp Panel by RT-PCR (Flu A&B, Covid) Nasopharyngeal Swab  Status: None   Collection Time: 11/17/2021 11:22 AM   Specimen: Nasopharyngeal Swab; Nasopharyngeal(NP) swabs in vial transport medium  Result Value Ref Range Status   SARS Coronavirus 2 by RT PCR NEGATIVE NEGATIVE Final    Comment: (NOTE) SARS-CoV-2 target nucleic acids are NOT DETECTED.  The SARS-CoV-2 RNA is generally detectable in upper respiratory specimens during the acute phase of infection. The lowest concentration of SARS-CoV-2 viral copies this assay can detect is 138 copies/mL. A negative result does not preclude SARS-Cov-2 infection and should not be used as the sole basis for treatment or other patient management decisions. A negative result may occur with  improper specimen collection/handling, submission of specimen other than nasopharyngeal swab, presence of viral mutation(s) within the areas targeted by this assay, and  inadequate number of viral copies(<138 copies/mL). A negative result must be combined with clinical observations, patient history, and epidemiological information. The expected result is Negative.  Fact Sheet for Patients:  EntrepreneurPulse.com.au  Fact Sheet for Healthcare Providers:  IncredibleEmployment.be  This test is no t yet approved or cleared by the [place] FDA and  has been authorized for detection and/or diagnosis of SARS-CoV-2 by FDA under an Emergency Use Authorization (EUA). This EUA will remain  in effect (meaning this test can be used) for the duration of the COVID-19 declaration under Section 564(b)(1) of the Act, 21 U.S.C.section 360bbb-3(b)(1), unless the authorization is terminated  or revoked sooner.       Influenza A by PCR NEGATIVE NEGATIVE Final   Influenza B by PCR NEGATIVE NEGATIVE Final    Comment: (NOTE) The Xpert Xpress SARS-CoV-2/FLU/RSV plus assay is intended as an aid in the diagnosis of influenza from Nasopharyngeal swab specimens and should not be used as a sole basis for treatment. Nasal washings and aspirates are unacceptable for Xpert Xpress SARS-CoV-2/FLU/RSV testing.  Fact Sheet for Patients: EntrepreneurPulse.com.au  Fact Sheet for Healthcare Providers: IncredibleEmployment.be  This test is not yet approved or cleared by the [place] FDA and has been authorized for detection and/or diagnosis of SARS-CoV-2 by FDA under an Emergency Use Authorization (EUA). This EUA will remain in effect (meaning this test can be used) for the duration of the COVID-19 declaration under Section 564(b)(1) of the Act, 21 U.S.C. section 360bbb-3(b)(1), unless the authorization is terminated or revoked.  Performed at [facility], [address] [place], [place] [address]   Blood Culture ID Panel (Reflexed)     Status: Abnormal   Collection Time: 11/18/2021 11:22  AM  Result Value Ref Range Status   Enterococcus faecalis NOT DETECTED NOT DETECTED Final   Enterococcus Faecium NOT DETECTED NOT DETECTED Final   Listeria monocytogenes NOT DETECTED NOT DETECTED Final   Staphylococcus species NOT DETECTED NOT DETECTED Final   Staphylococcus aureus (BCID) NOT DETECTED NOT DETECTED Final   Staphylococcus epidermidis NOT DETECTED NOT DETECTED Final   Staphylococcus lugdunensis NOT DETECTED NOT DETECTED Final   Streptococcus species DETECTED (A) NOT DETECTED Final    Comment: CRITICAL RESULT CALLED TO, READ BACK BY AND VERIFIED WITH: JASON ROBINS AT 0051 11/10/21 GAA    Streptococcus agalactiae NOT DETECTED NOT DETECTED Final   Streptococcus pneumoniae DETECTED (A) NOT DETECTED Final    Comment: CRITICAL RESULT CALLED TO, READ BACK BY AND VERIFIED WITH: JASON ROBINS AT 0051 11/10/21 GAA    Streptococcus pyogenes NOT DETECTED NOT DETECTED Final   A.calcoaceticus-baumannii NOT DETECTED NOT DETECTED Final   Bacteroides fragilis NOT DETECTED NOT DETECTED Final   Enterobacterales NOT DETECTED  NOT DETECTED Final   Enterobacter cloacae complex NOT DETECTED NOT DETECTED Final   Escherichia coli NOT DETECTED NOT DETECTED Final   Klebsiella aerogenes NOT DETECTED NOT DETECTED Final   Klebsiella oxytoca NOT DETECTED NOT DETECTED Final   Klebsiella pneumoniae NOT DETECTED NOT DETECTED Final   Proteus species NOT DETECTED NOT DETECTED Final   Salmonella species NOT DETECTED NOT DETECTED Final   Serratia marcescens NOT DETECTED NOT DETECTED Final   Haemophilus influenzae NOT DETECTED NOT DETECTED Final   Neisseria meningitidis NOT DETECTED NOT DETECTED Final   Pseudomonas aeruginosa NOT DETECTED NOT DETECTED Final   Stenotrophomonas maltophilia NOT DETECTED NOT DETECTED Final   Candida albicans NOT DETECTED NOT DETECTED Final   Candida auris NOT DETECTED NOT DETECTED Final   Candida glabrata NOT DETECTED NOT DETECTED Final   Candida krusei NOT DETECTED NOT  DETECTED Final   Candida parapsilosis NOT DETECTED NOT DETECTED Final   Candida tropicalis NOT DETECTED NOT DETECTED Final   Cryptococcus neoformans/gattii NOT DETECTED NOT DETECTED Final    Comment: Performed at [facility], [address] [place], [place] [address]  Blood culture (routine x 2)     Status: Abnormal   Collection Time: 11/18/2021 11:42 AM   Specimen: BLOOD  Result Value Ref Range Status   Specimen Description   Final    BLOOD BLOOD LEFT FOREARM Performed at [facility], [address], [place], [place] [address]    Special Requests   Final    BOTTLES DRAWN AEROBIC AND ANAEROBIC Blood Culture adequate volume Performed at [facility], [address] [place], [place] [address]    Culture  Setup Time   Final    GRAM POSITIVE COCCI IN BOTH AEROBIC AND ANAEROBIC BOTTLES CRITICAL RESULT CALLED TO, READ BACK BY AND VERIFIED WITH: JASON ROBINS AT 0051 11/10/21 GAA    Culture (A)  Final    STREPTOCOCCUS PNEUMONIAE SUSCEPTIBILITIES PERFORMED ON PREVIOUS CULTURE WITHIN THE LAST 5 DAYS. Performed at [facility], [place] [address] [place], [place] [address]    Report Status 11/12/2021 FINAL  Final  Acid Fast Smear (AFB)     Status: None   Collection Time: 11/29/2021 11:55 AM   Specimen: Bronchial Alveolar Lavage  Result Value Ref Range Status   AFB Specimen Processing Concentration  Final   Acid Fast Smear Negative  Final    Comment: (NOTE) Performed At: [facility] [place], [place] [id number] Rush Farmer MD YO:[id number]    Source (AFB) BRONCHIAL ALVEOLAR LAVAGE  Final    Comment: Performed at [facility], [place] [address] [place], [place] [address]  Fungus Culture With Stain     Status: None (Preliminary result)   Collection Time: 11/21/2021 11:55 AM   Specimen: Bronchial Alveolar Lavage  Result Value Ref Range Status   Fungus Stain Final report  Final    Comment: (NOTE) Performed At: [facility] [place], [place] [id number] Rush Farmer MD OB:[id number]    Fungus (Mycology) Culture PENDING  Incomplete   Fungal Source BRONCHIAL ALVEOLAR LAVAGE  Final    Comment: Performed at [facility], [place] [address] [place], [place] [address]  Fungus Culture Result     Status: None   Collection Time: 11/24/2021 11:55 AM  Result Value Ref Range Status   Result 1 Comment  Final    Comment: (NOTE) KOH/Calcofluor preparation:  no fungus observed. Performed At: [place],  [place] [id number] Rush Farmer MD BL:[id number]   Urine Culture     Status: None   Collection Time: 11/15/2021 12:08 PM   Specimen: Urine, Random  Result Value Ref Range Status   Specimen Description   Final    URINE, RANDOM Performed at [facility], [address], [place], [place] [address]    Special Requests   Final    NONE Performed at [facility], [address], [place], [place] [address]    Culture   Final    NO GROWTH Performed at [facility], [place] [address], [place], [place] [address]    Report Status 11/10/2021 FINAL  Final  MRSA Next Gen by PCR, Nasal     Status: None   Collection Time: 11/12/2021  4:13 PM   Specimen: Nasal Mucosa; Nasal Swab  Result Value Ref Range Status   MRSA by PCR Next Gen NOT DETECTED NOT DETECTED Final    Comment: (NOTE) The GeneXpert MRSA Assay (FDA approved for NASAL specimens only), is one component of a comprehensive MRSA colonization surveillance program. It is not intended to diagnose MRSA infection nor to guide or monitor treatment for MRSA infections. Test performance is not FDA approved in patients less than 15 years old. Performed at [facility], [address] [place], [place] [address]   Blastomyces Antigen     Status: None   Collection Time: 11/26/2021  6:39 PM   Specimen: Vein; Blood  Result Value Ref Range Status   Blastomyces Antigen None Detected None  Detected ng/mL Final    Comment: (NOTE) Results reported as ng/mL in 0.2 - 14.7 ng/mL range Results above the limit of detection but below 0.2 ng/mL are reported as 'Positive, Below the Limit of Quantification' Results above 14.7 ng/mL are reported as 'Positive, Above the Limit of Quantification'    Specimen Type SERUM  Final    Comment: (NOTE) Performed At: [facility] [place], [place] [id number] Bruce Donath MD TL:[id number]   Culture, blood (Routine X 2) w Reflex to ID Panel     Status: None (Preliminary result)   Collection Time: 11/25/2021  6:39 PM   Specimen: BLOOD  Result Value Ref Range Status   Specimen Description BLOOD BLOOD LEFT HAND  Final   Special Requests   Final    BOTTLES DRAWN AEROBIC AND ANAEROBIC Blood Culture adequate volume   Culture   Final    NO GROWTH 4 DAYS Performed at [facility], [address] [place], [place] [address]    Report Status PENDING  Incomplete  Culture, blood (Routine X 2) w Reflex to ID Panel     Status: None (Preliminary result)   Collection Time: 11/07/2021  6:40 PM   Specimen: BLOOD  Result Value Ref Range Status   Specimen Description BLOOD BLOOD RIGHT HAND  Final   Special Requests   Final    BOTTLES DRAWN AEROBIC AND ANAEROBIC Blood Culture adequate volume   Culture   Final    NO GROWTH 4 DAYS Performed at [facility], [address] [place],  [address]    Report Status PENDING  Incomplete  Acid Fast Smear (AFB)     Status: None   Collection Time: 11/25/2021  7:06 PM   Specimen: Bronchoalveolar Lavage; Bronchial aspirate  Result Value Ref Range Status   AFB Specimen Processing Concentration  Final   Acid Fast Smear Negative  Final    Comment: (NOTE) Performed At:  [facility] [place], [place] [id number] Rush Farmer MD DP:[id number]    Source (AFB) SPUTUM  Final    Comment: Performed at [facility], [address]  [place], [place] [address]  Culture, Respiratory w Gram Stain     Status: None   Collection Time: 11/10/21 11:55 AM   Specimen: Bronchoalveolar Lavage; Respiratory  Result Value Ref Range Status   Specimen Description   Final    BRONCHIAL ALVEOLAR LAVAGE Performed at [facility], [address] [place], [place] [address]    Special Requests   Final    NONE Performed at [facility], [place], [place] [address]    Gram Stain   Final    ABUNDANT WBC PRESENT,BOTH PMN AND MONONUCLEAR MODERATE GRAM POSITIVE COCCI RARE GRAM VARIABLE ROD    Culture   Final    FEW Consistent with normal respiratory flora. Performed at [facility], [place] [address], [place], [place] [address]    Report Status 11/12/2021 FINAL  Final  CULTURE, BLOOD (ROUTINE X 2) w Reflex to ID Panel     Status: None   Collection Time: 11/12/21  9:37 AM   Specimen: BLOOD  Result Value Ref Range Status   Specimen Description BLOOD A-LINE  Final   Special Requests   Final    BOTTLES DRAWN AEROBIC AND ANAEROBIC Blood Culture adequate volume   Culture   Final    NO GROWTH 5 DAYS Performed at [facility], [address] [place], [place] [address]    Report Status 11/17/2021 FINAL  Final  CULTURE, BLOOD (ROUTINE X 2) w Reflex to ID Panel     Status: None   Collection Time: 11/12/21  9:37 AM   Specimen: BLOOD  Result Value Ref Range Status   Specimen Description BLOOD BLOOD LEFT HAND  Final   Special Requests   Final    BOTTLES DRAWN AEROBIC AND ANAEROBIC Blood Culture adequate volume   Culture   Final    NO GROWTH 5 DAYS Performed at [facility], [place] [place],  [address]    Report Status 11/17/2021 FINAL  Final    Lab Basic Metabolic Panel: Recent Labs  Lab 11/12/21 0028 11/12/21 0403 11/12/21 0916 11/13/21 0354 11-18-21 0517  NA 137 135 135 136 137  K  --  3.9  --  3.8 6.8*  CL  --  105  --  98 102  CO2  --  23  --  24 12*   GLUCOSE  --  103*  --  135* 31*  BUN  --  50*  --  84* 112*  CREATININE  --  1.96*  --  4.75* 8.04*  CALCIUM  --  7.7*  --  7.3* 6.2*  MG  --  3.1*  --  3.3* 3.4*  PHOS  --  6.6*  --  10.3* 15.7*   Liver Function Tests: Recent Labs  Lab 11/12/21 1550 November 18, 2021 0517  AST 153* 2,765*  ALT 39 263*  ALKPHOS 79 129*  BILITOT 2.8* 2.4*  PROT 6.9 6.6  ALBUMIN 2.5* 2.1*   No results for input(s): LIPASE, AMYLASE in the last 168 hours. No results for input(s): AMMONIA in the last 168 hours. CBC: Recent Labs  Lab 11/12/21 0403 11/13/21 0354 Nov 18, 2021 0018 2021/11/18 0517  WBC 32.3* 35.0*  --  33.1*  NEUTROABS 27.1* 26.8*  --   --   HGB 10.4* 10.4* 9.5* 8.9*  HCT 34.9* 34.7* 32.1* 29.7*  MCV 98.0 98.3  --  98.0  PLT 126* 175  --  162   Cardiac Enzymes: No results for input(s): CKTOTAL, CKMB, CKMBINDEX, TROPONINI in the last 168 hours. Sepsis Labs: Recent Labs  Lab 11/12/21 0403 11/13/21 0354 11/13/21 0856 11/13/21 1141 12-03-21 0151 03-Dec-2021 0517  WBC 32.3* 35.0*  --   --   --  33.1*  LATICACIDVEN  --   --  1.9 2.5* >9.0* >9.0*      Krystyne Tewksbury 11/18/2021, 11:43 AM

## 2021-11-30 NOTE — Progress Notes (Signed)
°   11/19/2021 0905  Clinical Encounter Type  Visited With Patient and family together  Visit Type Spiritual support;Social support;Other (Comment) (bereavement)  Referral From Chaplain  Consult/Referral To Chaplain  Spiritual Encounters  Spiritual Needs Grief support;Prayer;Emotional   Chaplain Burris joined other [employer] and a Spanish interpreter, Marchelle Folks, to help offer prayer and bereavment support to large family presence. Chaplain Burris did have a relationship with the Pt's spouse and was therefore able to offer care and support to her particularly and to their young son as well. Offered Pt's son a chance to share his feelings and to facilitate his understanding and response to this loss.

## 2021-11-30 NOTE — Progress Notes (Addendum)
°   11/09/2021 0221  Clinical Encounter Type  Visited With Patient and family together  Visit Type Initial;Spiritual support;Social support;Patient actively dying;Critical Care  Referral From Nurse  Consult/Referral To Chaplain  Spiritual Encounters  Spiritual Needs Prayer;Emotional;Ritual;Sacred text;Grief support   Chaplain responded to page from nurse to offer grief support to family (EOL). PT had a large family presence. Wife, mother, father, and brothers were at bedside. Family are in a deep place of lament. Chaplain ministered with hospitality, prayer, intentional/compassionate presence, and reflective listening. Chaplain made space for expression of feelings, and created an atmosphere conducive for family to speak words of love to PT. Family stated they were very grateful for the presence of the chaplain. Chaplain available if further needed.   Posey Boyer, MDiv

## 2021-11-30 NOTE — Progress Notes (Signed)
Pt's wife, 2 brothers, mother, and Chaplain at bedside.  We discussed the pt's declining status despite maximum medical therapy.  Concern bowel ischemia vs liver ischemia.  Pt is in the dying process.   Followed up on CODE STATUS (Limited Code with NO CPR) from previous discussion with Dr. Earlie Server yesterday.  Given that pt continues to decline with multiorgan failure, there would be little benefit to resuscitation, along with the likelihood that we would we be placing undue suffering to the patient.    Pt's family do not want to subject undue suffering, and are in agreement with changing CODE STATUS to FULL DNR.      Harlon Ditty, AGACNP-BC [facility] Prefer epic messenger for cross cover needs If after hours, please call E-link

## 2021-11-30 NOTE — Progress Notes (Signed)
All pressors at maximum ordered dose. Elvina Sidle NP aware, nothing else to add to current medications to improve blood pressure. Family notified and at bedside now. Will continue to monitor.

## 2021-11-30 NOTE — Progress Notes (Signed)
°   10/31/2021 0850  Clinical Encounter Type  Visited With Patient and family together  Visit Type Follow-up;Spiritual support  Referral From Nurse  Consult/Referral To Chaplain  Spiritual Encounters  Spiritual Needs Prayer;Emotional;Grief support   Chaplain Oleta Mouse responded to a page in ICU 20-end of life. Chaplain provided a ministry of presence, words of comfort and expressed deep condolences to several family members. I ended the visited with prayer translated by the interpretor and anointed the Pt's body with [employer]. Chaplain Burris relieved me and continued support with the family.

## 2021-11-30 NOTE — Progress Notes (Signed)
Patient expired; time of death 70. Extensive family at bedside, including wife. Lines and tubes removed, body prepared for morgue.

## 2021-11-30 DEATH — deceased

## 2021-12-10 LAB — FUNGUS CULTURE RESULT

## 2021-12-10 LAB — FUNGUS CULTURE WITH STAIN

## 2021-12-10 LAB — FUNGAL ORGANISM REFLEX

## 2021-12-29 LAB — ACID FAST CULTURE WITH REFLEXED SENSITIVITIES (MYCOBACTERIA): Acid Fast Culture: NEGATIVE

## 2022-01-29 IMAGING — DX DG CHEST 1V PORT
1 series · 2 of 2 positions shown · non-contrast
Comparison: 11/09/2021

CLINICAL DATA: NG tube, intubation

EXAM:
PORTABLE CHEST 1 VIEW

[Series 1: chest ap · 0.14mm/px · 2 of 2 slices shown]
[im 1/2]
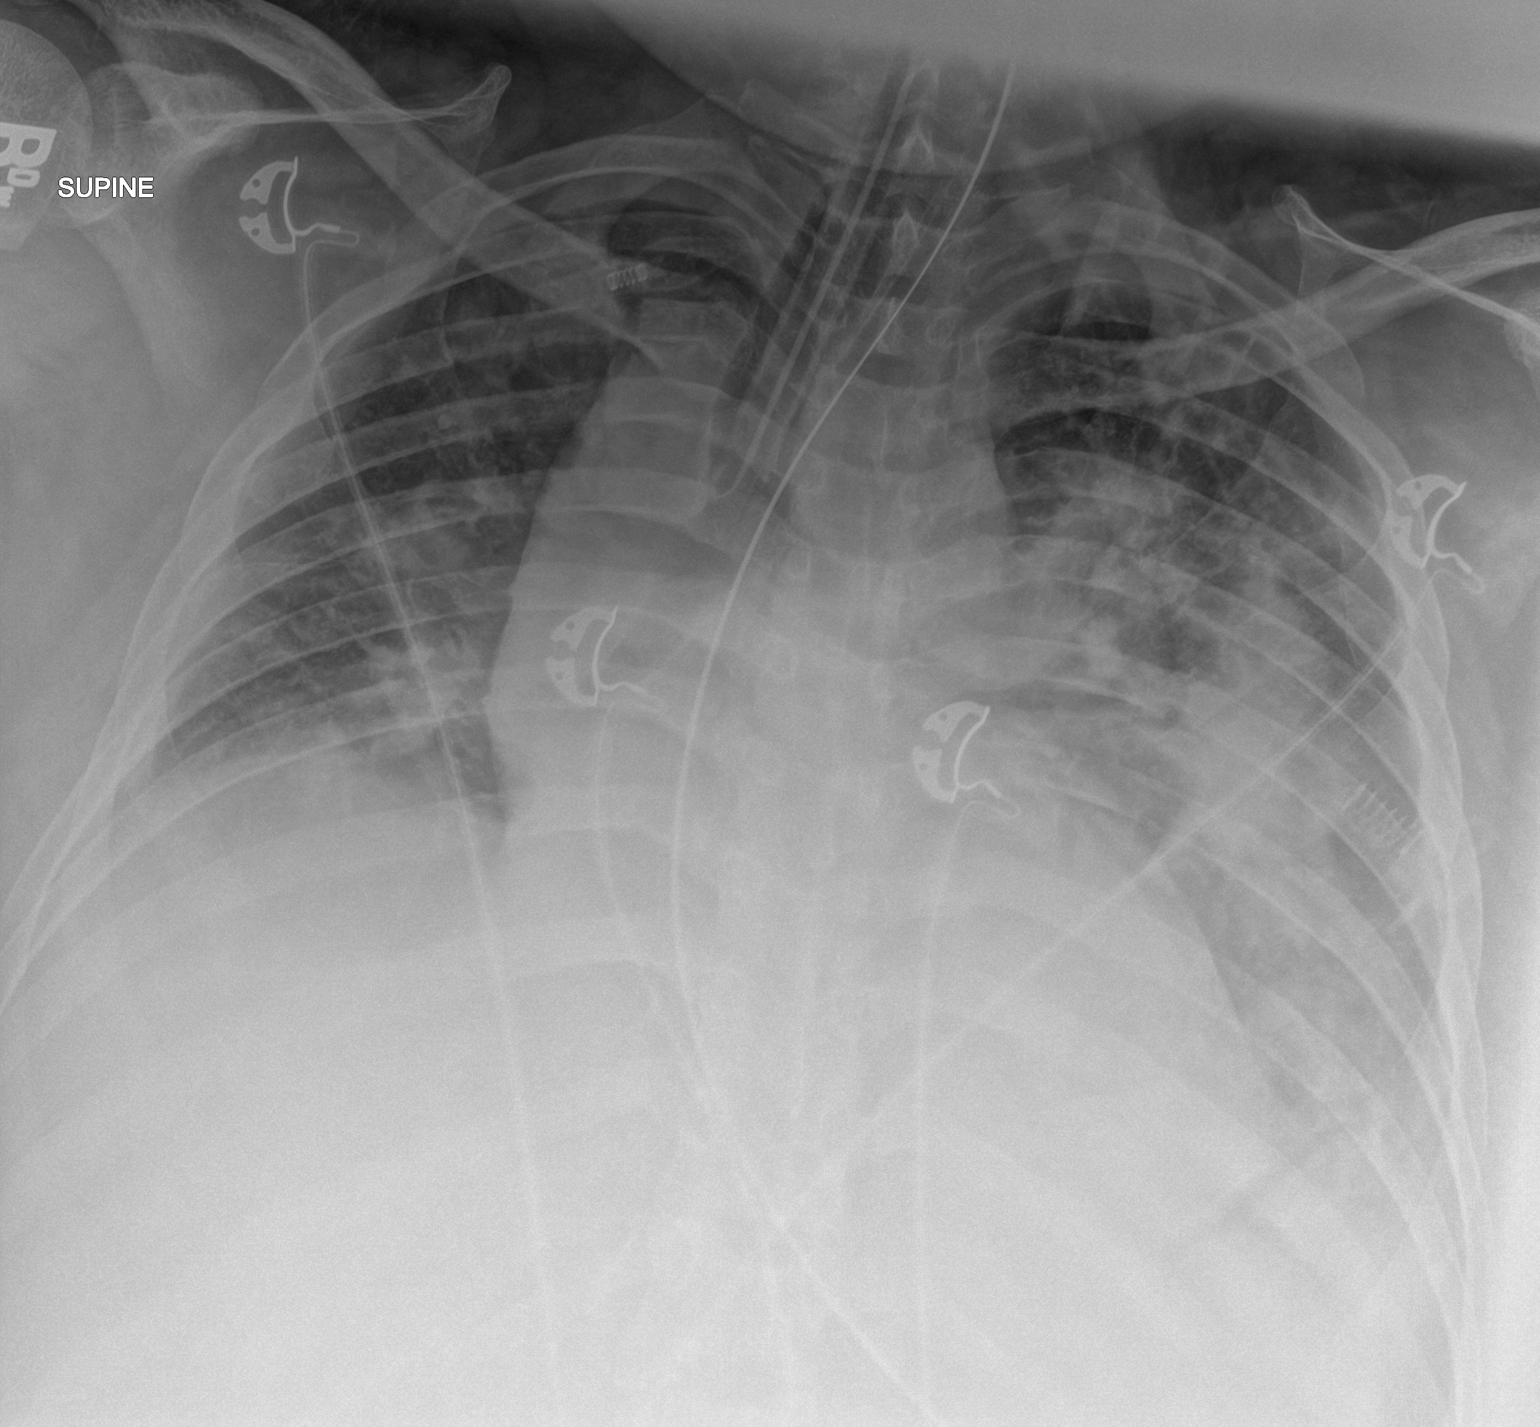
[im 2/2]
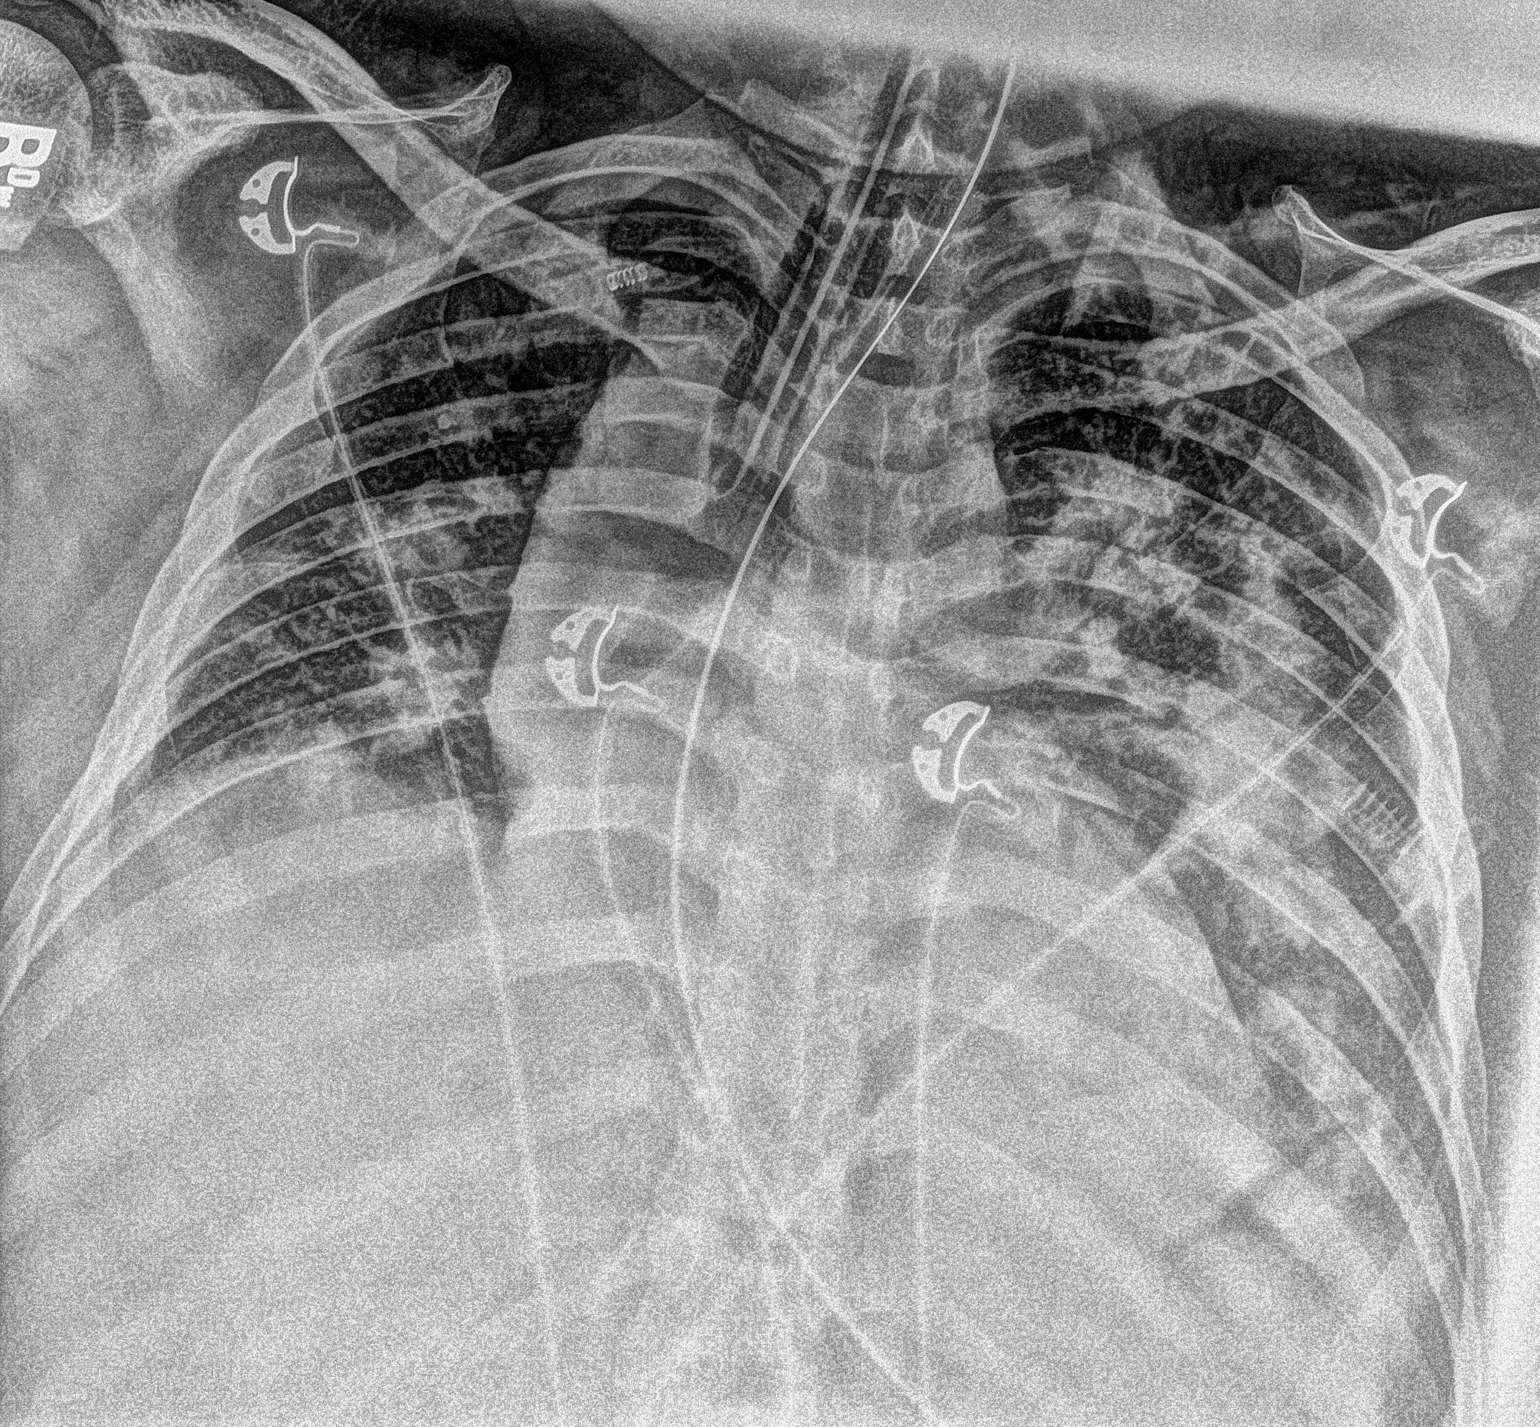

[2 of 2 positions shown; findings below may reference images not displayed]

FINDINGS: Endotracheal tube is 2 cm above the carina. NG tube is in the
stomach. Bilateral airspace disease worsening since prior study,
left greater than right. Findings concerning for pneumonia. Heart is
borderline in size.
IMPRESSION: Support devices as above.

Worsening bilateral airspace disease, left greater than right
concerning for pneumonia.

## 2022-01-30 IMAGING — US US ABDOMEN LIMITED
1 series · 13 of 25 positions shown · non-contrast
Comparison: CT Abdomen and Pelvis 11/09/2021.
COMPARISON: CT Abdomen and Pelvis 11/09/2021.

Addendum:
CLINICAL DATA: 35-year-old male with abnormal elevated LFTs.

EXAM:
ULTRASOUND ABDOMEN LIMITED RIGHT UPPER QUADRANT

[Series 1: us abdomen limited ruq (liver/gb) · 52 acquisitions, 13 frames shown]
[im 1/52]
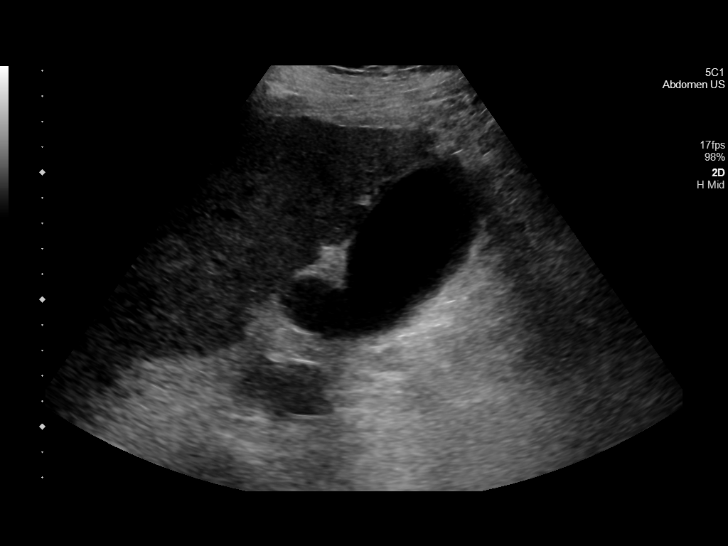
[im 5/52]
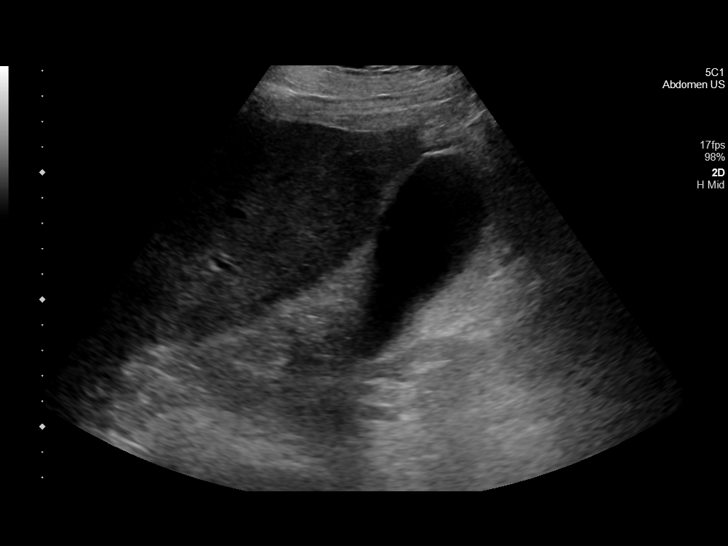
[im 9/52]
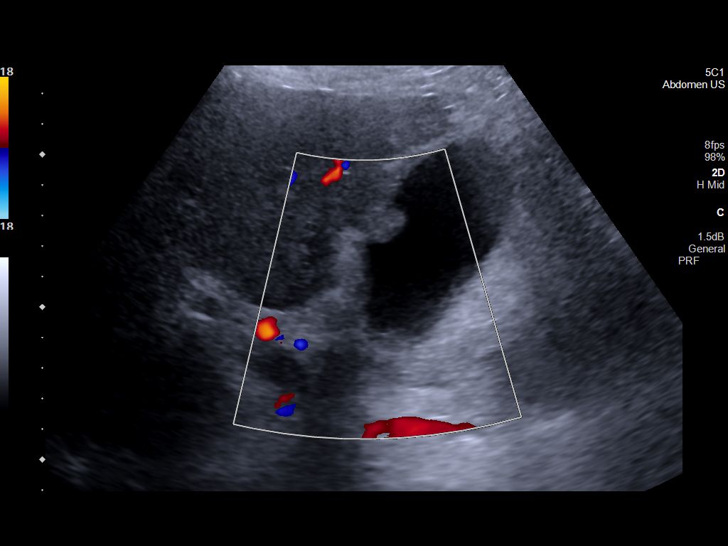
[im 13/52]
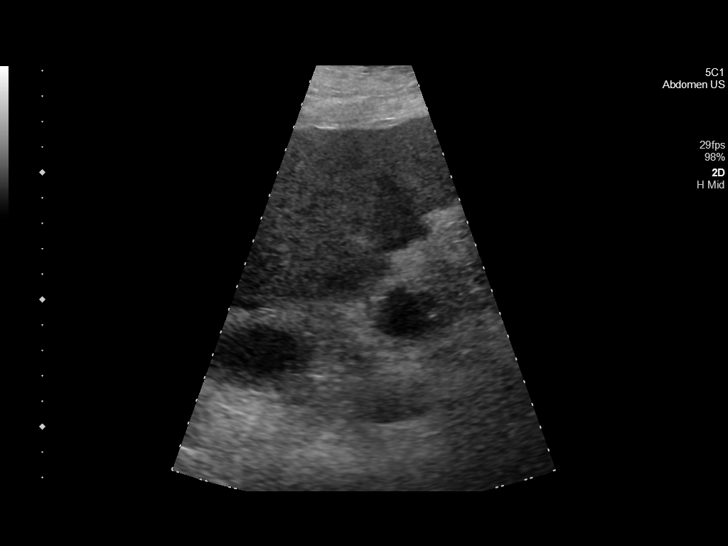
[im 18/52]
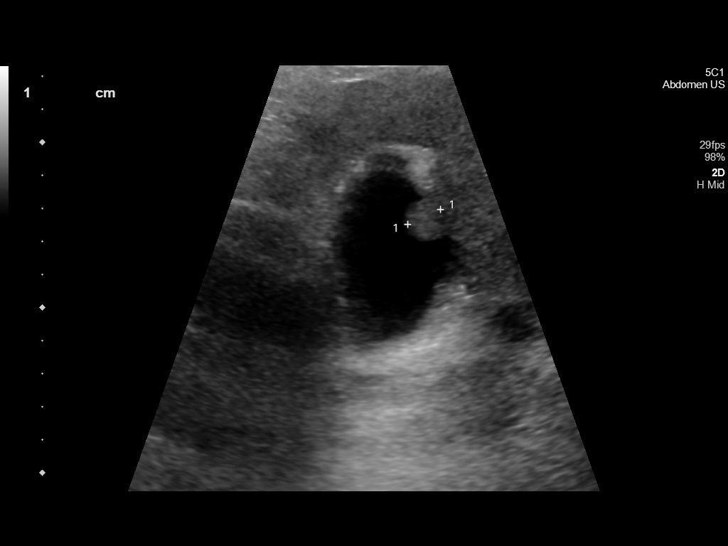
[im 22/52]
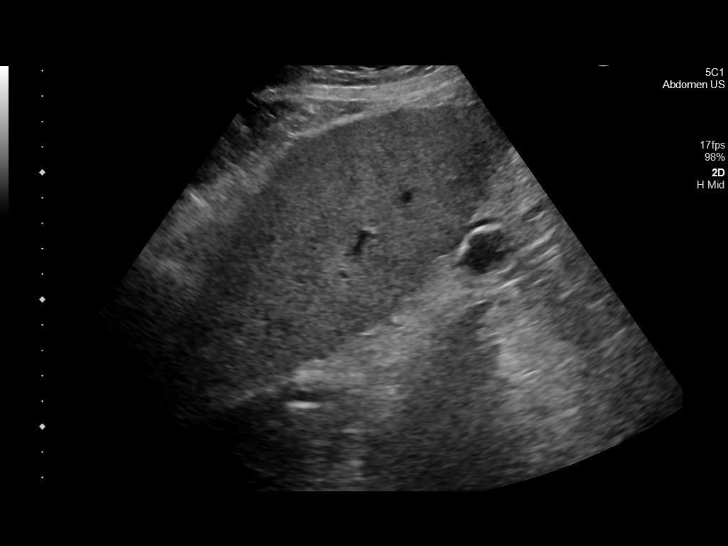
[im 26/52]
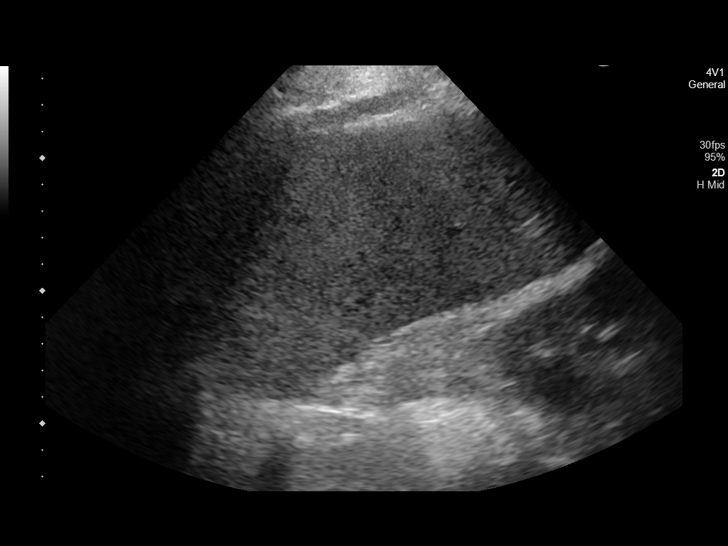
[im 30/52]
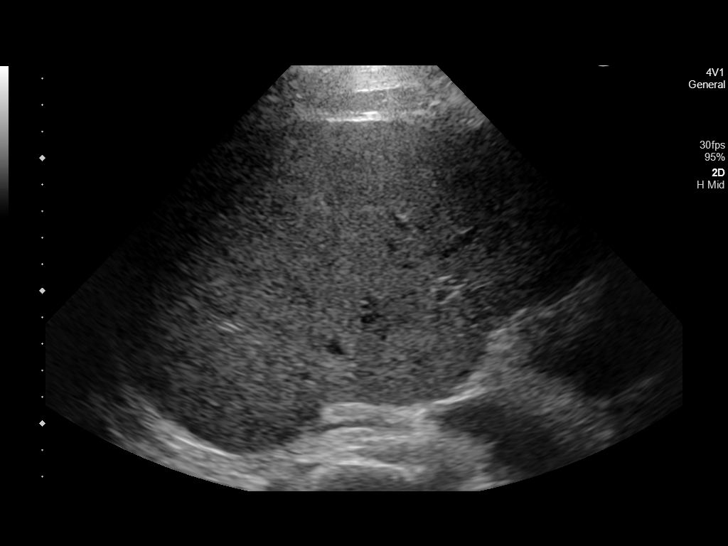
[im 35/52]
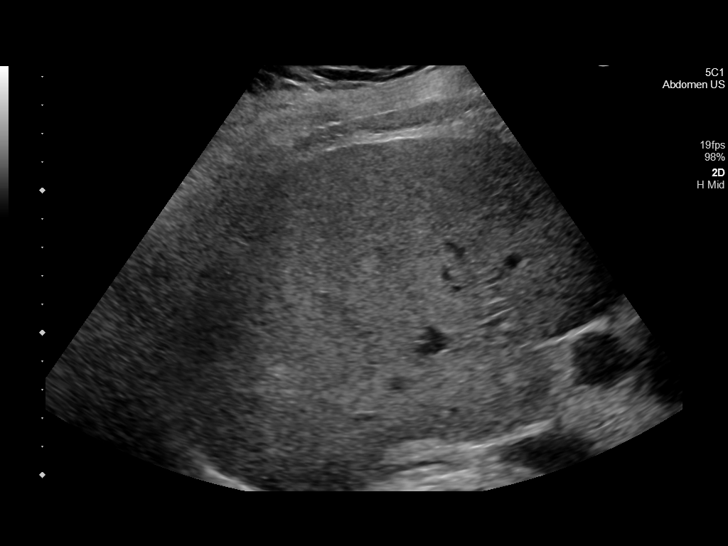
[im 39/52]
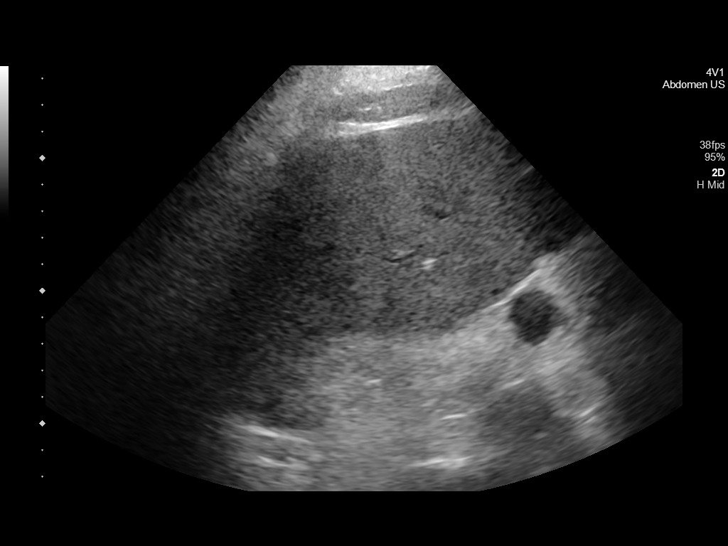
[im 43/52]
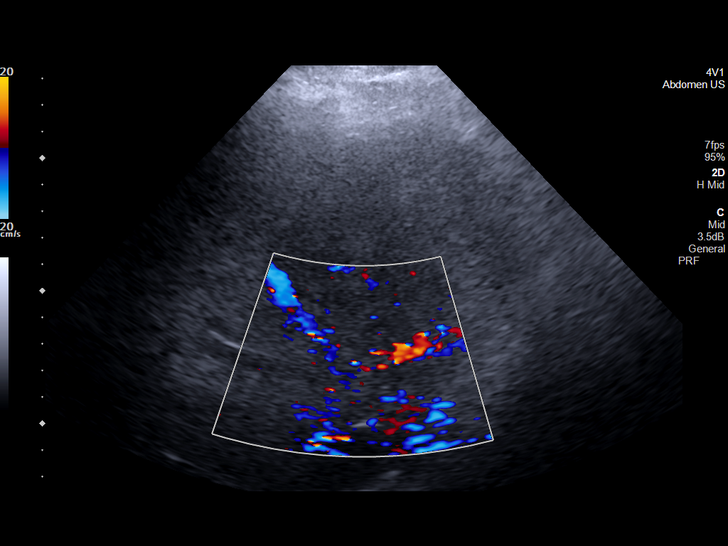
[im 47/52]
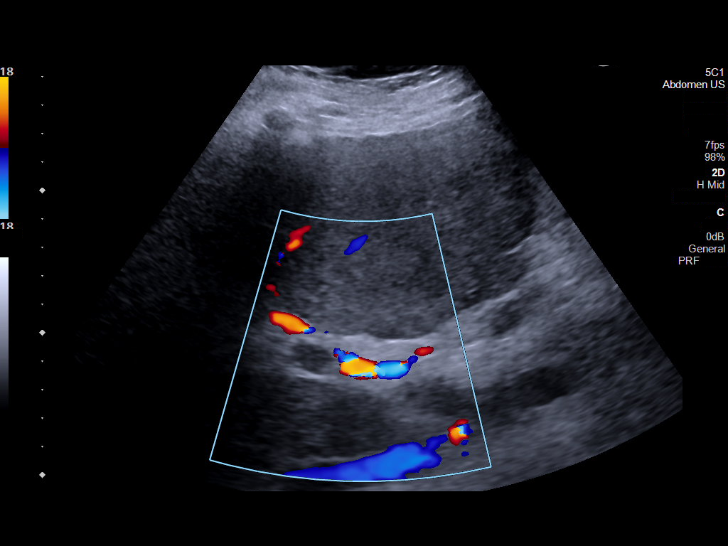
[im 52/52]
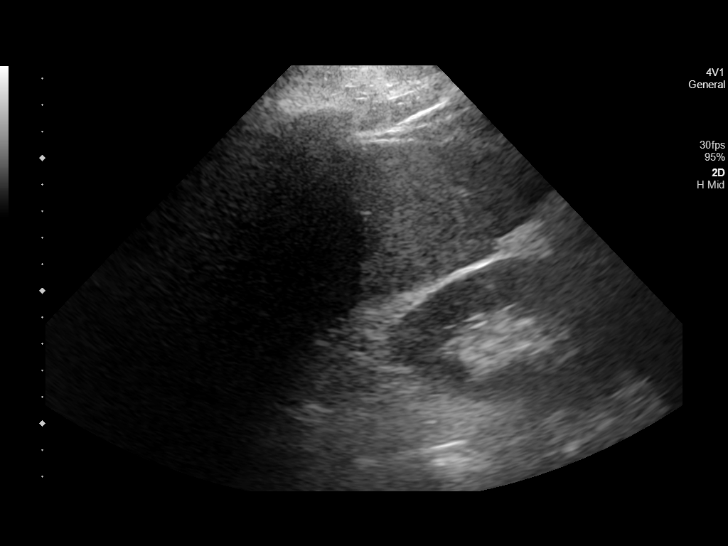

[13 of 25 positions shown; findings below may reference images not displayed]

FINDINGS: Gallbladder:

Background gallbladder wall thickness remains within normal limits,
but there are 2 foci of adherent tumefactive sludge (such as series
1, image 10). No shadowing echogenic stones. No pericholecystic
fluid. Patient is intubated, no sonographic Murphy sign evaluated.

Common bile duct:

Diameter: 3 mm, normal.

Liver:

Hepatomegaly and mildly nodular liver contour (image 36). Background
liver echogenicity within normal limits. No discrete liver lesion.
Absent Doppler flow in the main portal vein (image 48).

Other: No ascites.  Negative visible right kidney.
IMPRESSION: 1. PORTAL VEIN THROMBOSIS at the liver hilum by Doppler ultrasound.
2. Hepatomegaly with nodular liver contour suspicious for cirrhosis.
No discrete liver lesion.
3. Gallbladder sludge but no evidence of acute cholecystitis or bile
duct obstruction.

ADDENDUM:
Study discussed by telephone with Dr. Benrabah in the ICU on 11/10/2021
at 5596 hours.

*** End of Addendum ***
FINDINGS: Gallbladder:

Background gallbladder wall thickness remains within normal limits,
but there are 2 foci of adherent tumefactive sludge (such as series
1, image 10). No shadowing echogenic stones. No pericholecystic
fluid. Patient is intubated, no sonographic Murphy sign evaluated.

Common bile duct:

Diameter: 3 mm, normal.

Liver:

Hepatomegaly and mildly nodular liver contour (image 36). Background
liver echogenicity within normal limits. No discrete liver lesion.
Absent Doppler flow in the main portal vein (image 48).

Other: No ascites.  Negative visible right kidney.
IMPRESSION: 1. PORTAL VEIN THROMBOSIS at the liver hilum by Doppler ultrasound.
2. Hepatomegaly with nodular liver contour suspicious for cirrhosis.
No discrete liver lesion.
3. Gallbladder sludge but no evidence of acute cholecystitis or bile
duct obstruction.

## 2022-02-02 IMAGING — DX DG CHEST 1V PORT
1 series · 1 of 1 positions shown · non-contrast
Comparison: November 09, 2021.

CLINICAL DATA: Pneumonia.

EXAM:
PORTABLE CHEST 1 VIEW

[chest ap]
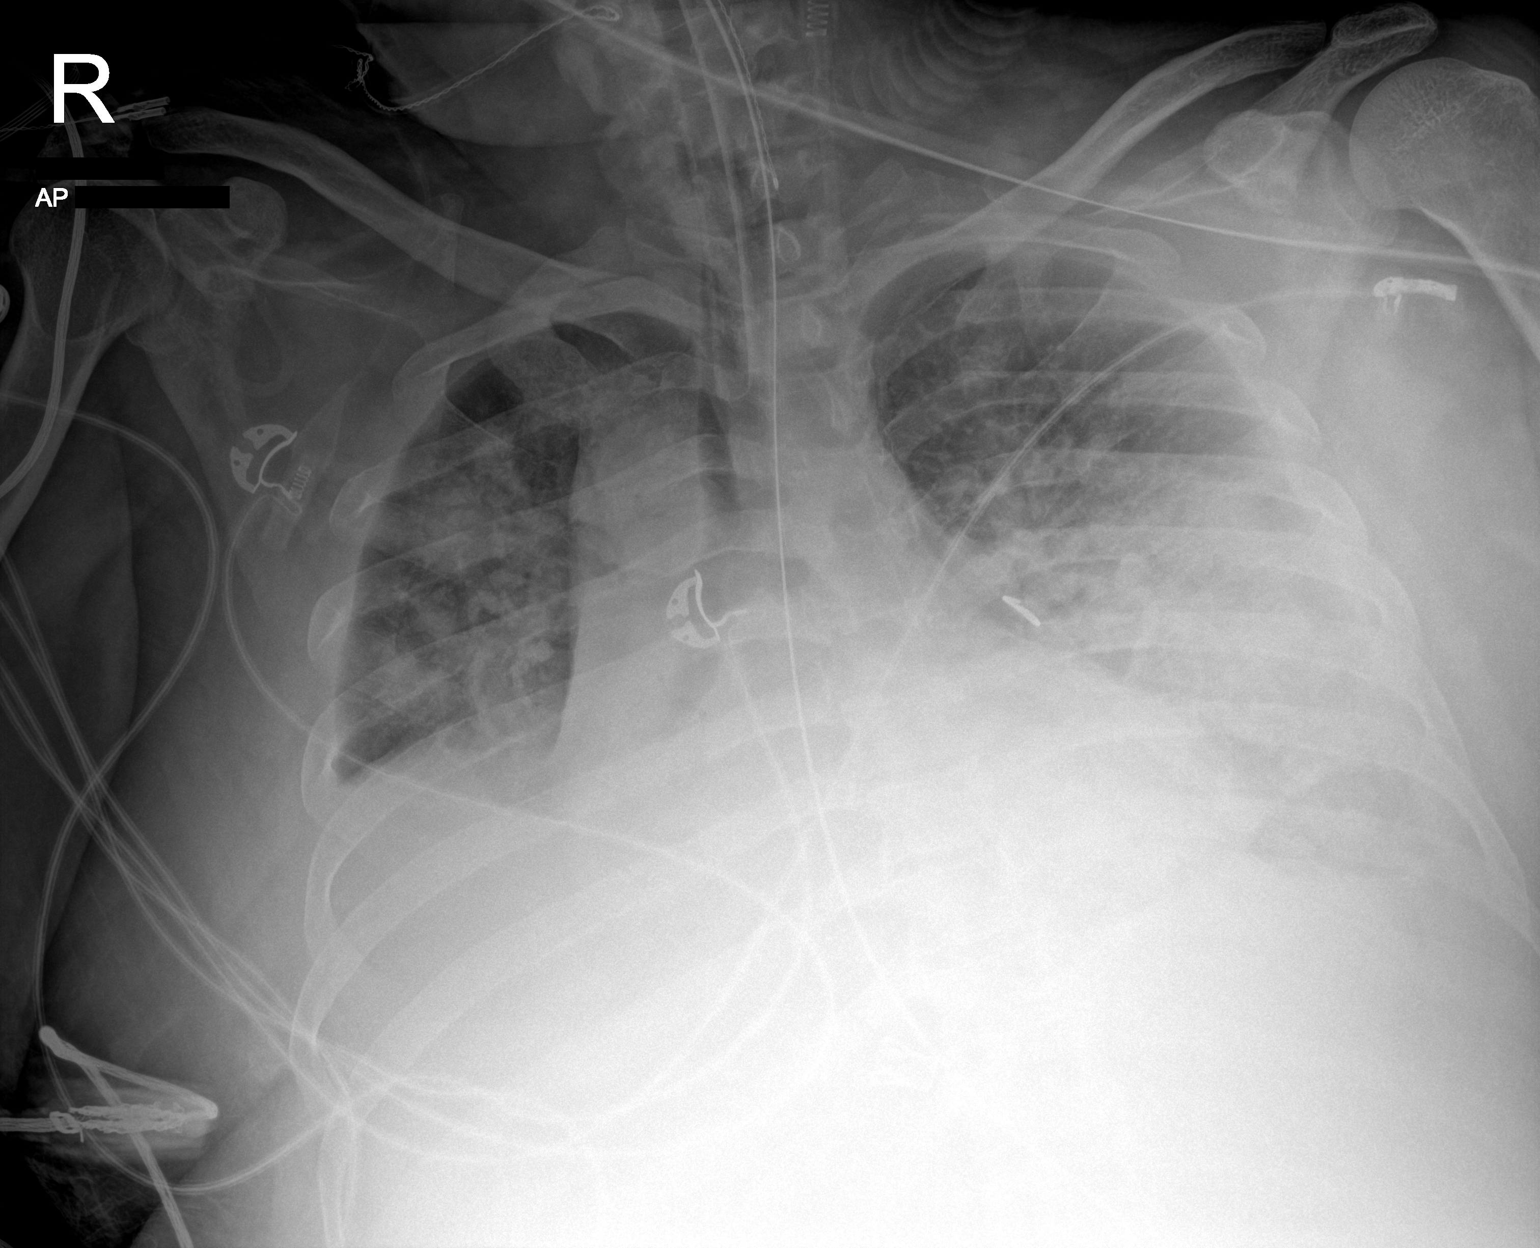

[1 of 1 positions shown; findings below may reference images not displayed]

FINDINGS: Stable cardiomediastinal silhouette. Endotracheal and nasogastric
tubes are unchanged in position. Stable left lung opacity is noted
concerning for pneumonia. Increased right lung opacity is noted
concerning for worsening pneumonia. Bony thorax is unremarkable.
IMPRESSION: Stable support apparatus. Stable left lung opacity concerning for
pneumonia. Increased right lung opacity is noted concerning for
pneumonia.

## 2022-02-03 LAB — ACID FAST CULTURE WITH REFLEXED SENSITIVITIES (MYCOBACTERIA): Acid Fast Culture: POSITIVE — AB

## 2022-02-03 LAB — AFB ORGANISM ID BY DNA PROBE
M avium complex: NEGATIVE
M gordonae: NEGATIVE
M kansasii: NEGATIVE
M tuberculosis complex: NEGATIVE

## 2022-02-03 LAB — ORG ID BY SEQUENCING RFLX AST

## 2022-02-03 IMAGING — DX DG CHEST 1V PORT
1 series · 1 of 1 positions shown · non-contrast
Comparison: Chest radiograph 1 day prior

CLINICAL DATA: Acute respiratory failure with hypoxia

EXAM:
PORTABLE CHEST 1 VIEW

[chest ap]
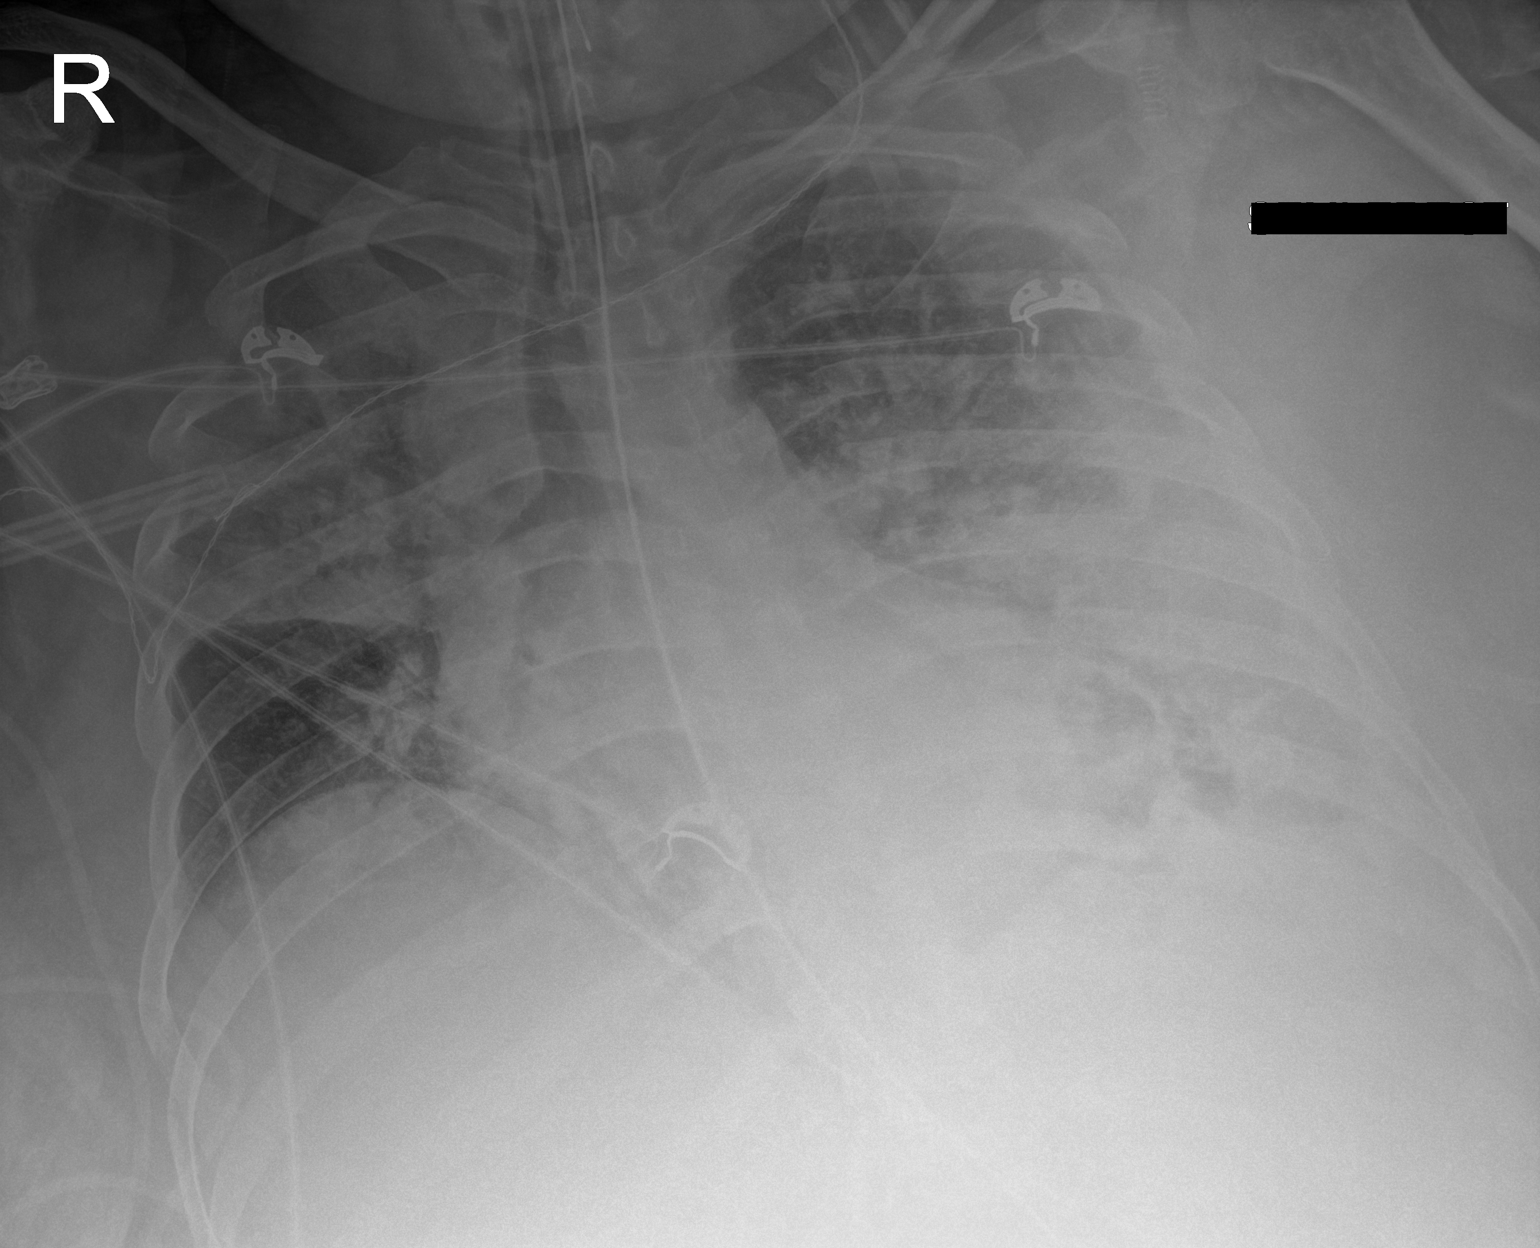

[1 of 1 positions shown; findings below may reference images not displayed]

FINDINGS: The endotracheal tube tip is approximately 5.3 cm from the carina.
The enteric catheter tip is off the field of view.

The cardiomediastinal silhouette is grossly stable.

There is significantly worsened confluent opacity in the right upper
lobe. Patchy opacities throughout the left lung are not
significantly changed. There are probable small bilateral pleural
effusions. There is no definite pneumothorax.

The bones are stable.
IMPRESSION: Significantly worsened opacity in the right upper lobe. Multifocal
airspace disease throughout the left lung is similar to the prior
study.

## 2022-09-30 LAB — HISTOPLASMA ANTIGEN, URINE: Histoplasma Antigen, urine: <0.5 (ref <0.5)
